# Patient Record
Sex: Female | Born: 1940 | Race: Black or African American | Hispanic: No
Health system: Southern US, Community
[De-identification: ages and names within clinical notes are randomized; demographics above are authoritative.]

---

## 2021-08-15 DIAGNOSIS — N1832 Chronic kidney disease, stage 3b: Secondary | ICD-10-CM | POA: Insufficient documentation

## 2021-08-15 DIAGNOSIS — G40901 Epilepsy, unspecified, not intractable, with status epilepticus: Secondary | ICD-10-CM

## 2021-08-15 DIAGNOSIS — Z79899 Other long term (current) drug therapy: Secondary | ICD-10-CM | POA: Insufficient documentation

## 2021-08-15 DIAGNOSIS — I482 Chronic atrial fibrillation, unspecified: Secondary | ICD-10-CM

## 2021-08-15 DIAGNOSIS — R569 Unspecified convulsions: Secondary | ICD-10-CM | POA: Diagnosis not present

## 2021-08-15 DIAGNOSIS — N1831 Chronic kidney disease, stage 3a: Secondary | ICD-10-CM

## 2021-08-15 DIAGNOSIS — Z952 Presence of prosthetic heart valve: Secondary | ICD-10-CM

## 2021-08-15 DIAGNOSIS — J9601 Acute respiratory failure with hypoxia: Secondary | ICD-10-CM

## 2021-08-15 DIAGNOSIS — G40919 Epilepsy, unspecified, intractable, without status epilepticus: Secondary | ICD-10-CM

## 2021-08-15 DIAGNOSIS — R791 Abnormal coagulation profile: Secondary | ICD-10-CM | POA: Insufficient documentation

## 2021-08-15 DIAGNOSIS — Z789 Other specified health status: Secondary | ICD-10-CM

## 2021-08-15 DIAGNOSIS — G8191 Hemiplegia, unspecified affecting right dominant side: Secondary | ICD-10-CM

## 2021-08-15 DIAGNOSIS — R061 Stridor: Secondary | ICD-10-CM

## 2021-08-15 DIAGNOSIS — J988 Other specified respiratory disorders: Secondary | ICD-10-CM

## 2021-08-15 DIAGNOSIS — I10 Essential (primary) hypertension: Secondary | ICD-10-CM

## 2021-08-15 DIAGNOSIS — I480 Paroxysmal atrial fibrillation: Secondary | ICD-10-CM

## 2021-08-15 DIAGNOSIS — Z7901 Long term (current) use of anticoagulants: Secondary | ICD-10-CM

## 2021-08-15 DIAGNOSIS — F0390 Unspecified dementia without behavioral disturbance: Secondary | ICD-10-CM

## 2021-08-15 DIAGNOSIS — R1310 Dysphagia, unspecified: Secondary | ICD-10-CM

## 2021-08-15 DIAGNOSIS — Z20822 Contact with and (suspected) exposure to covid-19: Secondary | ICD-10-CM | POA: Insufficient documentation

## 2021-08-15 DIAGNOSIS — D72829 Elevated white blood cell count, unspecified: Secondary | ICD-10-CM

## 2021-08-15 DIAGNOSIS — E876 Hypokalemia: Secondary | ICD-10-CM

## 2021-08-15 DIAGNOSIS — R4701 Aphasia: Secondary | ICD-10-CM

## 2021-08-15 NOTE — ED Notes (Addendum)
Patient's granddaughter, Renaldo Reel called at this time. Verbal consent obtained to transfer patient to [facility] via [facility]. Herbert Seta, RN witnessed verbal consent.

## 2021-08-15 NOTE — ED Triage Notes (Signed)
Pt arrives via [facility] from [facility]. Pt presented to [facility] for 15 minute witness seizure with R sided gaze. 2mg  ativan and 3g keppra given pta. Code stroke activated at . Per [facility]link, pt was moving all extremities en route. Pt arrives intubated with foley and og tube in place. Propofol infusing at 103mcg/kg/min.

## 2021-08-15 NOTE — ED Provider Notes (Signed)
[facility] Provider Note    Event Date/Time   First MD Initiated Contact with Patient 08/15/21 1322     (approximate)   History   Chief Complaint: Seizures   HPI  Miranda Rhodes is a 81 y.o. female with a past history of seizure disorder on Keppra who was brought to the ED due to witnessed seizure at home.  Last seizure was about a year ago and she has been compliant with her medications.  No recent acute illnesses or trauma.  On arrival patient is altered, not responsive and unable to provide any history or answer any questions or participate in exam.  EMS report that the patient did vomit 1 time on scene.     Physical Exam   Triage Vital Signs: ED Triage Vitals  Enc Vitals Group     BP      Pulse      Resp      Temp      Temp src      SpO2      Weight      Height      Head Circumference      Peak Flow      Pain Score      Pain Loc      Pain Edu?      Excl. in [place]?     Most recent vital signs: Vitals:   08/15/21 1530 08/15/21 1600  BP: (!) 169/87 (!) 194/89  Pulse: 62 66  Resp: (!) 23 (!) 22  Temp: (!) 96.8 F (36 C) (!) 96.7 F (35.9 C)  SpO2: 100% 100%    General: Not responsive. CV:  Good peripheral perfusion.  Regular rate rhythm Resp:  Normal effort.  Clear to auscultation bilaterally Abd:  No distention.  Soft nontender Other:  Not alert, not oriented.  Gaze fixed to the right.  Pupils 2 mm bilaterally, symmetric.  Extraocular movements untestable.  There are some supraclavicular muscle twitching movements, but otherwise no spontaneous muscle movements.  She has muscle tone with passive movement of the left upper extremity and left lower extremity, and flaccidity with passive movement of the right upper and right lower extremities.  No response to painful stimulus.   ED Results / Procedures / Treatments   Labs (all labs ordered are listed, but only abnormal results are displayed) Labs Reviewed  BASIC METABOLIC PANEL  - Abnormal; Notable for the following components:      Result Value   Glucose, Bld 116 (*)    Creatinine, Ser 1.47 (*)    Calcium 8.8 (*)    GFR, Estimated 36 (*)    All other components within normal limits  PROTIME-INR - Abnormal; Notable for the following components:   Prothrombin Time 23.8 (*)    INR 2.2 (*)    All other components within normal limits  APTT - Abnormal; Notable for the following components:   aPTT 40 (*)    All other components within normal limits  CBC - Abnormal; Notable for the following components:   RBC 3.76 (*)    All other components within normal limits  BLOOD GAS, ARTERIAL - Abnormal; Notable for the following components:   pH, Arterial 7.47 (*)    pO2, Arterial 156 (*)    Acid-Base Excess 3.9 (*)    All other components within normal limits  RESP PANEL BY RT-PCR (FLU A&B, COVID) ARPGX2  ETHANOL  DIFFERENTIAL  LACTIC ACID, PLASMA  URINE DRUG SCREEN, QUALITATIVE (  [facility] ONLY)  URINALYSIS, ROUTINE W REFLEX MICROSCOPIC     EKG Interpreted by me Sinus rhythm rate of 77, left axis, first-degree AV block.  Normal QRS ST segments and T waves.  No ischemic changes.   RADIOLOGY CT head interpreted by me, negative for intracranial hemorrhage.  Radiology report reviewed.  CTA/CTP head unremarkable.  Chest x-ray interpreted by me, endotracheal tube in good position.  Radiology report reviewed noting possible infiltrate in right midlung   PROCEDURES:  .Critical Care  Performed by: Sharman Cheek, MD Authorized by: Sharman Cheek, MD   Critical care provider statement:    Critical care time (minutes):  40   Critical care time was exclusive of:  Separately billable procedures and treating other patients   Critical care was necessary to treat or prevent imminent or life-threatening deterioration of the following conditions:  CNS failure or compromise   Critical care was time spent personally by me on the following activities:  Development of  treatment plan with patient or surrogate, discussions with consultants, evaluation of patient's response to treatment, examination of patient, obtaining history from patient or surrogate, ordering and performing treatments and interventions, ordering and review of laboratory studies, ordering and review of radiographic studies, pulse oximetry, re-evaluation of patient's condition and review of old charts   Care discussed with: accepting provider at another facility   Comments:       Procedure Name: Intubation Date/Time: 08/15/2021 4:09 PM  Performed by: Sharman Cheek, MDPre-anesthesia Checklist: Patient identified, Patient being monitored, Emergency Drugs available, Timeout performed and Suction available Oxygen Delivery Method: Non-rebreather mask Preoxygenation: Pre-oxygenation with 100% oxygen Induction Type: Rapid sequence Ventilation: Mask ventilation without difficulty Laryngoscope Size: Glidescope and 3 Grade View: Grade I Number of attempts: 1 Placement Confirmation: ETT inserted through vocal cords under direct vision, CO2 detector and Breath sounds checked- equal and bilateral Secured at: 22 cm Tube secured with: ETT holder Dental Injury: Teeth and Oropharynx as per pre-operative assessment        MEDICATIONS ORDERED IN ED: Medications  propofol (DIPRIVAN) 10 mg/mL bolus/IV push 167 mg (167 mg Intravenous See Procedure Record 08/15/21 1543)  propofol (DIPRIVAN) 1000 MG/100ML infusion (20 mcg/kg/min  83.5 kg Intravenous New Bag/Given 08/15/21 1458)  propofol (DIPRIVAN) 1000 MG/100ML infusion (  See Procedure Record 08/15/21 1542)  LORazepam (ATIVAN) injection 2 mg (2 mg Intravenous Given 08/15/21 1344)  iohexol (OMNIPAQUE) 350 MG/ML injection 100 mL (100 mLs Intravenous Contrast Given 08/15/21 1350)  levETIRAcetam (KEPPRA) 3,000 mg in sodium chloride 0.9 % 250 mL IVPB (0 mg Intravenous Stopped 08/15/21 1431)  rocuronium bromide 100 MG/10ML SOSY (60 mg  Given by Other 08/15/21 1450)   propofol (DIPRIVAN) 10 mg/mL bolus/IV push (120 mg  Given by Other 08/15/21 1450)  morphine (PF) 4 MG/ML injection 4 mg (4 mg Intravenous Given 08/15/21 1557)     IMPRESSION / MDM / ASSESSMENT AND PLAN / ED COURSE  I reviewed the triage vital signs and the nursing notes.                              Differential diagnosis includes, but is not limited to, status epilepticus, intracranial hemorrhage, LVO stroke, toxidrome  Patient's presentation is most consistent with acute presentation with potential threat to life or bodily function.  Patient presents with altered mental status, aphasia, right-sided paralysis, rightward gaze fixation.  She is having some jerking muscle movements as well.  Biggest concerns are status epilepticus versus  LVO stroke versus intracranial hemorrhage.  Will initiate code stroke, obtain stat CT head, plan to give IV Ativan to see if this improves her mental status.  ----------------------------------------- 4:12 PM on 08/15/2021 ----------------------------------------- CT and CT angiogram negative for acute findings.  Discussed case with neurology who evaluated the patient in the ED and recommends transfer to [facility] for continuous EEG monitoring due to suspected status epilepticus.  Patient's mental status is still poor, not interactive or able to follow commands, but the right gaze fixation has resolved to a neutral gaze.  Patient received Keppra load in the ED.  She was intubated for airway protection prior to transfer. Case was discussed with [facility] neurology Dr. Vanetta Shawl as well as ICU Dr. Imogene Burn.  Case discussed with ED attending Dr. Lynelle Doctor who accepts the patient for transfer due to lack of available ICU beds.  Patient given IV morphine postintubation for pain control, maintained on propofol infusion for sedation.       FINAL CLINICAL IMPRESSION(S) / ED DIAGNOSES   Final diagnoses:  Status epilepticus (HCC)     Rx / DC Orders   ED Discharge  Orders     None        Note:  This document was prepared using Dragon voice recognition software and may include unintentional dictation errors.   Sharman Cheek, MD 08/15/21 [phone]

## 2021-08-15 NOTE — ED Notes (Signed)
Neurology and pharmacist at bedside in CT

## 2021-08-15 NOTE — Procedures (Signed)
Patient Name: Miranda Rhodes  MRN: [id number]  Epilepsy Attending: Charlsie Quest  Referring Physician/Provider: Erick Blinks, MD Date: 10/16/2021 Duration: 29.29 mins  Patient history: 81 y.o. female with PMH of epilepsy on Keppra 500 mg twice daily who presented with right hemiplegia, right gaze deviation and aphasia along with subtle clonic movement of RUE. EEG to evaluate for seizure  Level of alertness:  comatose  AEDs during EEG study: LEV, Propofol  Technical aspects: This EEG study was done with scalp electrodes positioned according to the 10-20 International system of electrode placement. Electrical activity was acquired at a sampling rate of 500Hz  and reviewed with a high frequency filter of 70Hz  and a low frequency filter of 1Hz . EEG data were recorded continuously and digitally stored.   Description: EEG showed generalized burst of sharply contoured 5-8Hz  theta-alpha activity lasting 2-3 seconds alternating with low amplitude 15-18hz  generalized beta activity. Hyperventilation and photic stimulation were not performed.     ABNORMALITY -Burst attenuation, generalized  IMPRESSION: This study is suggestive of severe to profound diffuse encephalopathy, nonspecific etiology but likely related to sedation. No seizures or epileptiform discharges were seen throughout the recording.  Fillmore Bynum 

## 2021-08-15 NOTE — ED Provider Notes (Signed)
Pt transferred from [facility] for status epilepticus.  Pt intubated for airway protection while at [facility].  CCM has been expecting pt and are here to see pt.  Dr. Derry Lory (neurology) is also aware pt is here.  He will order the EEG.  Pt will be admitted by CCM.   Jacalyn Lefevre, MD 08/15/21 1753

## 2021-08-15 NOTE — ED Notes (Signed)
Per Dr. Scotty Court, activate code stroke. To CT at this time

## 2021-08-15 NOTE — ED Notes (Addendum)
Dr. Scotty Court, respiratory therapy, Herbert Seta RN, Beecher Mcardle RN, and this RN all at bedside for intubation. Time out conducted. Patient identified via name, birthday, and MRN

## 2021-08-15 NOTE — Progress Notes (Signed)
Pt transported to 4N22 on vent w/settings per flowsheet. Pt tol well.

## 2021-08-15 NOTE — ED Notes (Signed)
EMTALA reviewed by Charge RN 

## 2021-08-15 NOTE — Progress Notes (Signed)
LTM EEG hooked up and running - no initial skin breakdown - push button tested - neuro notified. [facility] monitoring.  

## 2021-08-15 NOTE — ED Notes (Signed)
Called [facility] for possible transfer spoke w/Kiona

## 2021-08-15 NOTE — H&P (Signed)
NAME:  Miranda Rhodes, MRN:  [id number], DOB:  03/18/1940, LOS: 0 ADMISSION DATE:  08/15/2021, CONSULTATION DATE:  08/15/21 REFERRING MD:  Dr. Selina Cooley, CHIEF COMPLAINT:  Seizure   History of Present Illness:  HPI obtained from medical chart review as patient is currently intubated and sedated on mechanical ventilation.  Also, pending chart merger with MRN [id number].   81 year old female with prior hx of afib and MR s/p MVR w/ St. Jude valve in 2005 on coumadin, HTN, HTN cardiomyopathy, HLD, prior L PCA stroke, seizure, mixed dementia (?alzheimers/ vascular dementia) who presented to [facility] ER after witnessed seizure at home.  Patient with prior hx of seizure in 05/2020, unexplained at that time, and since been on keppra with no reported seizures since.    EMS reported patient vomited prior to ER arrival.  In ER, code stroke was activated given her continued altered mental status, aphasia, right sided hemiparesis, and rightward gaze with some jerking movements noted.  Code stroke activated.  Seen by Neurology.  Initial CTH and CTA and perfusion were negative for LVO.   She was given ativan and keppra load which abated movements and intubated for airway protection.   She has been afebrile, hypertensive as high as 194/89, NSR and labs significant for sCr 1.47 (previous 1.4 in 05/2021), normal WBC and lactate, neg UA, INR 2.2 and CXR showing satisfactory placement of ETT/ OGT, questionable RML infiltrate.   She was transferred to [facility] for continous EEG monitoring and further care, PCCM admitting.   Spoke with son, Marvetta Gibbons by phone.  Patient was in her normal state of health prior to events today.  Reports compliance with keppra.  Notes hx of possible dementia however patient is independent in in her ADLs.   Pertinent  Medical History  Afib and MR s/p MVR w/ St. Jude 2005 on coumadin, HTN, HTN cardiomyopathy, HLD, prior L PCA stroke, seizure, mixed dementia (?alzheimer's/ vascular dementia)  [facility]  Events: Including procedures, antibiotic start and stop dates in addition to other pertinent events   Tx [facility] to [facility], witnessed seizure, vomited, intubated   Interim History / Subjective:  [facility] reported patient moving all extremities during transport.  Remains on propofol for sedation  Objective   Blood pressure (!) 163/91, pulse (!) 57, temperature (!) 96.6 F (35.9 C), temperature source Temporal, resp. rate 18, SpO2 100 %.    Vent Mode: AC FiO2 (%):  [40 %] 40 % Set Rate:  [16 bmp] 16 bmp Vt Set:  [450 mL] 450 mL PEEP:  [5 cmH20] 5 cmH20  No intake or output data in the 24 hours ending 08/15/21 1748 There were no vitals filed for this visit.  Examination: General:  critically ill elderly female intubated and sedated on MV in NAD HEENT: MM pink/moist, ETT, OGT, pupils 2/3/ reactive, anicteric, no neck rigidity  Neuro: sedated, does not open eyes or f/c CV: rr, NSR, +click PULM:  MV supported breaths, clear, no wheeze GI: soft, bs+, ND Extremities: warm/dry, no LE edema  Skin: no rashes   Resolved Hospital Problem list    Assessment & Plan:   Seizure r/o SE - hx of seizure 4/22 on keppra  P:  - Primary management per neurology  - Respiratory support as below/ above - Seizure precautions  - serial neuro exams  - ativan per seizure  - AEDs per neurology > keppra 1500mg  BID - cEEG per Neurology (MRI compatible leads) - MRI brain ordered - allow permissive HTN till  acute stroke ruled out, goal SBP < 220/110 - Maintain neuro protective measures; goal for eurothermia, euglycemia, eunatermia, normoxia, and PCO2 goal of 35-40 - Aspirations precautions  - no clear infectious etiology for seizure, suspect possible RML infiltrate could be from aspiration pna given witnessed vomiting while altered.  Consider LP, defer to Neuro.  Prior imaging questions normal pressure hydrocephalus and hx of questionable dementia, ? Etiology of seizure - monitor CBGs, add SSI if >  180  Acute respiratory insufficiency related to above Possible RML aspiration pna vs pneumonitis  - full MV support, 4-8cc/kg IBW with goal Pplat <30 and DP<15  - rate 15, recheck ABG in 1hr - CXR post transport  -VAP prevention protocol/ PPI -PAD protocol for sedation> propofol/ prn fentanyl with scheduled bowel regimen, RASS goal -1/-2 +/- per neuro depending on EEG findings - wean FiO2 as able for SpO2 >92%  - daily SAT & SBT- when cleared by neuro - send trach asp - hold on abx for now, monitor clinically  Hx Afib and MR s/p MVR w/St. Jude vavle on coumadin, INR goal 2-3 - remains in NSR, continue tele monitoring  - INR 2.2 today, recheck in am - hold coumadin.  Consider heparin bridge but hold for now till definitive neuro plans for ?LP/ MRI brain   HTN Hx HTN cardiomyopathy w/ recovered EF HLD - hold home HTN, allow for permissive HTN as above - euvolemic on exam   At risk for AKI, hx CKD3a - sCR at baseline - hold on foley, monitor for retention/ prn bladder scan - trend renal indices/ strict I/Os  Best Practice (right click and "Reselect all SmartList Selections" daily)   Diet/type: NPO DVT prophylaxis: SCD GI prophylaxis: PPI Lines: N/A Foley:  N/A Code Status:  full code Last date of multidisciplinary goals of care discussion [7/7]  Son, Aliene Beams updated by phone, [phone]  Labs   CBC: Recent Labs  Lab 08/15/21 1340  WBC 5.7  NEUTROABS 3.8  HGB 12.4  HCT 36.7  MCV 97.6  PLT 204    Basic Metabolic Panel: Recent Labs  Lab 08/15/21 1340  NA 140  K 3.7  CL 107  CO2 28  GLUCOSE 116*  BUN 17  CREATININE 1.47*  CALCIUM 8.8*   GFR: Estimated Creatinine Clearance: 32.6 mL/min (A) (by C-G formula based on SCr of 1.47 mg/dL (H)). Recent Labs  Lab 08/15/21 1340 08/15/21 1343  WBC 5.7  --   LATICACIDVEN  --  1.4    Liver Function Tests: No results for input(s): "AST", "ALT", "ALKPHOS", "BILITOT", "PROT", "ALBUMIN" in the last 168  hours. No results for input(s): "LIPASE", "AMYLASE" in the last 168 hours. No results for input(s): "AMMONIA" in the last 168 hours.  ABG    Component Value Date/Time   PHART 7.47 (H) 08/15/2021 1529   PCO2ART 38 08/15/2021 1529   PO2ART 156 (H) 08/15/2021 1529   HCO3 27.7 08/15/2021 1529   O2SAT 100 08/15/2021 1529     Coagulation Profile: Recent Labs  Lab 08/15/21 1340  INR 2.2*    Cardiac Enzymes: No results for input(s): "CKTOTAL", "CKMB", "CKMBINDEX", "TROPONINI" in the last 168 hours.  HbA1C: No results found for: "HGBA1C"  CBG: No results for input(s): "GLUCAP" in the last 168 hours.  Review of Systems:   Unable   Past Medical History:  See above  Surgical History:  Unable   Social History:    Unable   Family History:  Her family history is not  on file.   Allergies Not on File   Home Medications  Prior to Admission medications   Not on File     Critical care time: 45 mins     Kennieth Rad, ACNP [facility] 08/15/2021, 6:31 PM  See Amion for pager If no response to pager, please call PCCM consult pager After 7:00 pm call Elink

## 2021-08-15 NOTE — ED Notes (Signed)
Report given to [facility] at this time. Reported they are approximately 10 minutes out.

## 2021-08-15 NOTE — ED Notes (Signed)
Activated Code Stroke w/[facility] 

## 2021-08-15 NOTE — Progress Notes (Signed)
EEG complete - results pending 

## 2021-08-15 NOTE — ED Triage Notes (Signed)
Patient from home where she had a 15 minute witnessed seizure. History of same, last seizure was approx 1 year ago. Upon arrival patient had R sided gaze and weakness and was not answering questions.

## 2021-08-15 NOTE — Consult Note (Addendum)
NEUROLOGY CONSULTATION NOTE   Date of service: August 15, 2021 Patient Name: Miranda Rhodes MRN:  [id number] DOB:  1940-07-01 Reason for consult: stroke code Requesting physician: Dr. Sharman Cheek _ _ _   _ __   _ __ _ _  __ __   _ __   __ _  History of Present Illness   This patient's chart is marked for merge with her pre-existing chart MRN [id number]   Next of kin primary decision maker is Aliene Beams [phone]  This is an 81 year old woman with a past medical history significant for chronic A-fib and mechanical mitral valve on warfarin, hypertension, seizure disorder on Keppra 500 mg twice daily, prior stroke per family without residual deficits who was brought in by EMS for right hemiplegia, right gaze deviation, and aphasia.  She was last seen normal by her son at approximately 81 AM.  He came back to the house after visiting another relative and found her with the symptoms.  Stroke code was activated on ED arrival. NIHSS = 26 with exam c/w seizure (gaze deviation towards hemiparetic side, mute, intermittent clonic movements RUE). Clonic movements aborted with ativan and R gaze deviation became intermittent. Head CT NAICP. TNK was not administered 2/2 on warfarin with INR 2.2. CTA showed no LVO. CT perfusion was neg. CNS imaging personally reviewed.   She received 2mg  IV ativan and 3g keppra after which she was intubated for airway protection.   At baseline per son she has mild memory impairment but remains independent in her ADLs.  No identifiable precipitating trigger for presentation in status epilepticus but stroke is on the differential.   ROS   UTA 2/2 patient unresponsive  Past History   Patient's other chart marked for merge reviewed. Relevant pmhx in HPI. Fhx noncontributory.   NKDA   Medications   (Not in a hospital admission)     Current Facility-Administered Medications:    morphine (PF) 4 MG/ML injection 4 mg, 4 mg, Intravenous, Once, ,  MD   propofol (DIPRIVAN) 10 mg/mL bolus/IV push 167 mg, 2 mg/kg, Intravenous, Once, Sharman Cheek, MD   propofol (DIPRIVAN) 1000 MG/100ML infusion, 20 mcg/kg/min, Intravenous, Continuous, Sharman Cheek, MD, Last Rate: 10.02 mL/hr at 08/15/21 1458, 20 mcg/kg/min at 08/15/21 1458   propofol (DIPRIVAN) 1000 MG/100ML infusion, , , ,  No current outpatient medications on file.  Vitals   Vitals:   08/15/21 1406 08/15/21 1430 08/15/21 1457  BP: (!) 191/85 (!) 191/95   Pulse: 77 76   Resp: (!) 22 (!) 21   Temp: 98.4 F (36.9 C)    TempSrc: Axillary    SpO2: 96% 95% 98%  Weight: 83.5 kg    Height: 5\' 5"  (1.651 m)       Body mass index is 30.62 kg/m.  Physical Exam   Physical Exam Gen: alert, unable to answer orientation questions 2/2 mutism, does not follow simple commands HEENT: Atraumatic, normocephalic;mucous membranes moist; oropharynx clear, tongue without atrophy or fasciculations. Neck: Supple, trachea midline. Resp: CTAB, no w/r/r CV: RRR, no m/g/r; nml S1 and S2. 2+ symmetric peripheral pulses. Abd: soft/NT/ND; nabs x 4 quad Extrem: Nml bulk; no cyanosis, clubbing, or edema.  Neuro: *MS: alert, unable to answer orientation questions 2/2 mutism, does not follow simple commands *Speech: mute *CN: PERRL, no blink to threat bilat, R forced gaze deviation, face symmetric at rest *Motor & sensory: withdrawal to noxious stimuli LUE and LLE but not R. At one point she developed rhythmic clonic  movements of her RUE which aborted with ativan *Reflexes: 2+ symm throughout toes down bilat *Coordination, gait: UTA  NIHSS  1a Level of Conscious.: 0 1b LOC Questions: 2 1c LOC Commands: 2 2 Best Gaze: 2 3 Visual: 2 4 Facial Palsy: 0 5a Motor Arm - left: 3 5b Motor Arm - Right: 4 6a Motor Leg - Left: 3 6b Motor Leg - Right: 4 7 Limb Ataxia: 0 8 Sensory: 1 9 Best Language: 3 10 Dysarthria: 2 11 Extinct. and Inatten.: 0  TOTAL: 26 (after ativan, suspected to still be  seizing)   Premorbid mRS = 1   Labs   CBC:  Recent Labs  Lab 08/15/21 1340  WBC 5.7  NEUTROABS 3.8  HGB 12.4  HCT 36.7  MCV 97.6  PLT 204    Basic Metabolic Panel:  Lab Results  Component Value Date   NA 140 08/15/2021   K 3.7 08/15/2021   CO2 28 08/15/2021   GLUCOSE 116 (H) 08/15/2021   BUN 17 08/15/2021   CREATININE 1.47 (H) 08/15/2021   CALCIUM 8.8 (L) 08/15/2021   GFRNONAA 36 (L) 08/15/2021   Lipid Panel: No results found for: "LDLCALC" HgbA1c: No results found for: "HGBA1C" Urine Drug Screen: No results found for: "LABOPIA", "COCAINSCRNUR", "LABBENZ", "AMPHETMU", "THCU", "LABBARB"  Alcohol Level     Component Value Date/Time   ETH <10 08/15/2021 1340     Impression   This is an 81 year old woman with a past medical history significant for chronic A-fib and mechanical mitral valve on warfarin, hypertension, seizure disorder on Keppra 500 mg twice daily, prior stroke per family without residual deficits who was brought in by EMS for right hemiplegia, right gaze deviation, and aphasia with active seizure activity. Stroke code was called but patient was not a TNK candidate 2/2 on warfarin with INR 2.2. CT head NAICP / CTA no LVO / CTP neg. She received 2mg  IV ativan and 3g keppra and her RUE clonic movements abated but she remained altered, unable to follow commands, and aphasic with intermittent gaze deviation. She was intubated for airway protection and started on propofol and will be transferred ED-to-ED to [facility] for LTM EEG, MRI and further workup and mgmt of status epilepticus.   Recommendations   - Transfer to [facility] ED-to-ED - On arrival to [facility] please page neurohospitalist to come evaluate patient. She will need STAT EEG f/b LTM with MR-compatible leads - MRI brain after arrival to [facility] - Continue keppra 1500mg  bid - Workup for metabolic/infectious triggers for breakthrough seizure - She will ultimately need admission to 4N neuro-ICU under CCM when bed is  available; neuro will continue to follow - Further med mgmt incl warfarin per admitting team at [facility] - Given concern for possible acute ischemic infarct, please allow permissive HTN up to 220/110 until stroke is ruled out by MRI. Use clevidipine gtt prn for BP above parameters; avoid oral antihypertensives - STAT head CT for any decline in exam - Further stroke workup per [facility] neurohospitalist team if MRI shows e/o acute infarct  This patient is critically ill and at significant risk of neurological worsening, death and care requires constant monitoring of vital signs, hemodynamics,respiratory and cardiac monitoring, neurological assessment, discussion with family, other specialists and medical decision making of high complexity. I spent 110 minutes of neurocritical care time  in the care of  this patient. This was time spent independent of any time provided by nurse practitioner or PA.  [facility], MD [facility] [phone]  If 7pm- 7am, please page neurology on call as listed in [place].

## 2021-08-15 NOTE — Progress Notes (Signed)
   08/15/21 1400  Clinical Encounter Type  Visited With Patient not available;Health care provider  Visit Type Initial;Code;ED  Referral From Care management  Consult/Referral To Chaplain   Chaplain responded to Code Stroke. Patient not available at time of visit. No family present.

## 2021-08-15 NOTE — Consult Note (Signed)
NEUROLOGY CONSULTATION NOTE   Date of service: August 15, 2021 Patient Name: Miranda Rhodes MRN:  [id number] DOB:  [birth year] Reason for consult: "concern for status epilepticus" Requesting Provider: Coralyn Helling, MD _ _ _   _ __   _ __ _ _  __ __   _ __   __ _  History of Present Illness  Miranda Rhodes is a 81 y.o. female with PMH significant for mechanical mitral valve and A-fib on warfarin, hypertension, epilepsy on Keppra 500 mg twice daily who presented initially to [facility] emergency department by EMS with right hemiplegia, right gaze deviation and aphasia.  She was initially brought in as a code stroke with a last known well of 1100 on 08/15/2021.  Initial NIH stroke scale in the ED was 26.  She had work-up with CT head without contrast, CT angio, CT perfusion which was negative for a large territory infarct, no ICH, no LVO or significant stenosis, no mismatch.  In the ED, she was noted to have clonic movement of the right upper extremity along with the fact that she had right gaze deviation, high suspicion for prolonged seizure/status epilepticus.  She was given 2 mg of Ativan and loaded with Keppra 3000 mg.  She was intubated in the ED for airway protection she was transferred to [facility] for further evaluation with continuous EEG.  I spoke to her son over the phone who reports that she started her morning out as usual.  She seemed fine.  Son got up and ate some food.  He left for [employer] and came back 20 minutes later and found her seizing on the floor.  The seizure went on for 3 to 4 minutes and she never woke up from it.  He called EMS and she was brought in to the hospital.  Per son, has been at her baseline for the last couple days, has not been endorsing any fever, no chills.  No obvious signs or symptoms of a URI, UTI, gastroenteritis that the patient endorsed to the son noted over the last few days.  She is compliant with her Keppra.  She does not  use any recreational substances.  She has not been started on any new medications.  ROS   Unable to obtain detailed review of system due to obtunded mentation, sedation with propofol.  Past History  No past medical history on file.  No family history on file. Social History   Socioeconomic History   Marital status: Divorced    Spouse name: Not on file   Number of children: Not on file   Years of education: Not on file   Highest education level: Not on file  Occupational History   Not on file  Tobacco Use   Smoking status: Not on file   Smokeless tobacco: Not on file  Substance and Sexual Activity   Alcohol use: Not on file   Drug use: Not on file   Sexual activity: Not on file  Other Topics Concern   Not on file  Social History Narrative   Not on file   Social Determinants of Health   Financial Resource Strain: Not on file  Food Insecurity: Not on file  Transportation Needs: Not on file  Physical Activity: Not on file  Stress: Not on file  Social Connections: Not on file   Not on File  Medications  (Not in a hospital admission)    Vitals   Vitals:   08/15/21  1750 08/15/21 1751 08/15/21 1800 08/15/21 1804  BP:   (!) 200/84 (!) 191/85  Pulse:   (!) 58 (!) 56  Resp:   18 15  Temp:      TempSrc:      SpO2: 100%  100% 100%  Weight:  83.5 kg    Height:  5\' 5"  (1.651 m)       Body mass index is 30.62 kg/m.  Physical Exam   General: Laying comfortably in bed; intubated HENT: Normal oropharynx and mucosa. Normal external appearance of ears and nose.  Neck: Supple, no pain or tenderness  CV: No JVD. No peripheral edema.  Pulmonary: Symmetric Chest rise.  Breathing over vent Abdomen: Soft to touch, non-tender.  Ext: No cyanosis, edema, or deformity  Skin: No rash. Normal palpation of skin.   Musculoskeletal: Normal digits and nails by inspection. No clubbing.  Neurologic Examination performed on propofol 50 mcg/kg/min.  Mental status/Cognition: No  response to voice or loud clap.  No response to tactile stimuli or to noxious stimuli.   Speech/language: Mute, no attempts to communicate, does not follow any commands.   Cranial nerves:   CN II Pupils equal and reactive to light.   CN III,IV,VI EOM intact to doll's eye with no gaze preference.   CN V Corneal reflex positive bilaterally   CN VII No facial grimace to noxious stimuli.   CN VIII Does not turn head towards speech.   CN IX & X Cough intact, unable to get suction catheter backing up to elicit a gag.   CN XI Head midline   CN XII Difficult to establish position of tongue given intubation, does not protrude on command   Motor/sensory:  Muscle bulk: poor, tone flaccied in all extremities No response to proximal pinch in any of the extremities.  Coordination/Complex Motor:  Unable to assess.  Labs   CBC:  Recent Labs  Lab 08/15/21 1340  WBC 5.7  NEUTROABS 3.8  HGB 12.4  HCT 36.7  MCV 97.6  PLT 204    Basic Metabolic Panel:  Lab Results  Component Value Date   NA 140 08/15/2021   K 3.7 08/15/2021   CO2 28 08/15/2021   GLUCOSE 116 (H) 08/15/2021   BUN 17 08/15/2021   CREATININE 1.47 (H) 08/15/2021   CALCIUM 8.8 (L) 08/15/2021   GFRNONAA 36 (L) 08/15/2021   Lipid Panel: No results found for: "LDLCALC" HgbA1c: No results found for: "HGBA1C" Urine Drug Screen:     Component Value Date/Time   LABOPIA NONE DETECTED 08/15/2021 1400   COCAINSCRNUR NONE DETECTED 08/15/2021 1400   LABBENZ NONE DETECTED 08/15/2021 1400   AMPHETMU NONE DETECTED 08/15/2021 1400   THCU NONE DETECTED 08/15/2021 1400   LABBARB NONE DETECTED 08/15/2021 1400    Alcohol Level     Component Value Date/Time   ETH <10 08/15/2021 1340    CT Head without contrast(Personally reviewed): CTH was negative for a large hypodensity concerning for a large territory infarct or hyperdensity concerning for an ICH   CT angio Head and Neck with contrast(Personally reviewed): No LVO  CT  perfusion head(personally reviewed):  MRI Brain: pending  cEEG:  pending  Impression   Miranda Rhodes is a 81 y.o. female with PMH significant for mechanical mitral valve and A-fib on warfarin, hypertension, epilepsy on Keppra 500 mg twice daily who presented initially to [facility] emergency department by EMS with right hemiplegia, right gaze deviation and aphasia along with subtle clonic movement of  RUE.  Her presentation is very concerning for prolonged seizure/status epilepticus.  The underlying etiology though is unclear.  Work-up so far with CT head, CTA, CTP is nonrevealing.  Our plan is to put her up on continuous EEG for further evaluation and get further imaging with MRI brain   Recommendations  - cEEG overnight - Keppra 1500mg  BID - MRI Brain with and without contrast after cEEG.  This patient is critically ill and at significant risk of neurological worsening, death and care requires constant monitoring of vital signs, hemodynamics,respiratory and cardiac monitoring, neurological assessment, discussion with family, other specialists and medical decision making of high complexity. I spent 30 minutes of neurocritical care time  in the care of  this patient. This was time spent independent of any time provided by nurse practitioner or PA.  [facility] Pager Number Erick Blinks 08/15/2021  6:54 PM   ______________________________________________________________________   Thank you for the opportunity to take part in the care of this patient. If you have any further questions, please contact the neurology consultation attending.  Signed,  10/16/2021 [facility] Pager Number Erick Blinks _ _ _   _ __   _ __ _ _  __ __   _ __   __ _

## 2021-08-16 DIAGNOSIS — R569 Unspecified convulsions: Secondary | ICD-10-CM | POA: Diagnosis not present

## 2021-08-16 DIAGNOSIS — G40901 Epilepsy, unspecified, not intractable, with status epilepticus: Secondary | ICD-10-CM | POA: Diagnosis not present

## 2021-08-16 DIAGNOSIS — J988 Other specified respiratory disorders: Secondary | ICD-10-CM | POA: Diagnosis not present

## 2021-08-16 NOTE — Progress Notes (Signed)
eLink Physician-Brief Progress Note Patient Name: Miranda Rhodes DOB: [birth year] MRN: [id number]   Date of Service  08/16/2021  HPI/Events of Note  Received sliding scale coverage CBG 141, 132 Creatinine 1.53  eICU Interventions  Low dose SSI ordered     Intervention Category Intermediate Interventions: Hyperglycemia - evaluation and treatment  Rosalie Gums Viola Kinnick 08/16/2021, 8:42 PM

## 2021-08-16 NOTE — Progress Notes (Signed)
  Transition of Care [facility]) Screening Note   Patient Details  Name: Miranda Rhodes Date of Birth: 13-Mar-1940   Transition of Care [facility]) CM/SW Contact:    Windle Guard, LCSW Phone Number: 08/16/2021, 8:19 AM    Transition of Care Department [facility]) has reviewed patient and noted no immediate TOC needs pending continued medical work-up. TOC team will continue to monitor patient advancement through interdisciplinary progression rounds to support any identified discharge supports as needed. If new patient transition needs arise, please place a TOC consult or reach out to [facility] team.

## 2021-08-16 NOTE — Progress Notes (Signed)
[facility] ADULT ICU REPLACEMENT PROTOCOL   The patient does apply for the [facility] Adult ICU Electrolyte Replacment Protocol based on the criteria listed below:   1.Exclusion criteria: TCTS patients, ECMO patients, and Dialysis patients 2. Is GFR >/= 30 ml/min? Yes.    Patient's GFR today is 33 3. Is SCr </= 2? Yes.   Patient's SCr is 1.56 mg/dL 4. Did SCr increase >/= 0.5 in 24 hours? No. 5.Pt's weight >40kg  Yes.   6. Abnormal electrolyte(s): K+ 3.3, Mag 1.6  7. Electrolytes replaced per protocol 8.  Call MD STAT for K+ </= 2.5, Phos </= 1, or Mag </= 1 Physician:  Thresa Ross 08/16/2021 6:25 AM

## 2021-08-16 NOTE — Progress Notes (Signed)
EEG maint complete.  ?

## 2021-08-16 NOTE — Progress Notes (Signed)
NAME:  Miranda Rhodes, MRN:  [id number], DOB:  04/14/1940, LOS: 1 ADMISSION DATE:  08/15/2021, CONSULTATION DATE:  08/15/21 REFERRING MD:  Dr. Selina Cooley, CHIEF COMPLAINT:  Seizure   History of Present Illness:  HPI obtained from medical chart review as patient is currently intubated and sedated on mechanical ventilation.  Also, pending chart merger with MRN [id number].   81 year old female with prior hx of afib and MR s/p MVR w/ St. Jude valve in 2005 on coumadin, HTN, HTN cardiomyopathy, HLD, prior L PCA stroke, seizure, mixed dementia (?alzheimers/ vascular dementia) who presented to [facility] ER after witnessed seizure at home.  Patient with prior hx of seizure in 05/2020, unexplained at that time, and since been on keppra with no reported seizures since.    EMS reported patient vomited prior to ER arrival.  In ER, code stroke was activated given her continued altered mental status, aphasia, right sided hemiparesis, and rightward gaze with some jerking movements noted.  Code stroke activated.  Seen by Neurology.  Initial CTH and CTA and perfusion were negative for LVO.   She was given ativan and keppra load which abated movements and intubated for airway protection.   She has been afebrile, hypertensive as high as 194/89, NSR and labs significant for sCr 1.47 (previous 1.4 in 05/2021), normal WBC and lactate, neg UA, INR 2.2 and CXR showing satisfactory placement of ETT/ OGT, questionable RML infiltrate.   She was transferred to [facility] for continous EEG monitoring and further care, PCCM admitting.   Spoke with son, Marvetta Gibbons by phone.  Patient was in her normal state of health prior to events today.  Reports compliance with keppra.  Notes hx of possible dementia however patient is independent in in her ADLs.   Pertinent  Medical History  Afib and MR s/p MVR w/ St. Jude 2005 on coumadin, HTN, HTN cardiomyopathy, HLD, prior L PCA stroke, seizure, mixed dementia (?alzheimer's/ vascular dementia)  Significant Hospital  Events: Including procedures, antibiotic start and stop dates in addition to other pertinent events   7/7 Tx [facility] to [facility], witnessed seizure, vomited, intubated  7/8 No seizures overnight   Interim History / Subjective:  Sedated on vent   Objective   Blood pressure (!) 181/83, pulse 75, temperature 99.3 F (37.4 C), temperature source Bladder, resp. rate 15, height 5\' 5"  (1.651 m), weight 80.5 kg, SpO2 100 %.    Vent Mode: PRVC FiO2 (%):  [40 %] 40 % Set Rate:  [15 bmp-18 bmp] 15 bmp Vt Set:  [450 mL] 450 mL PEEP:  [5 cmH20] 5 cmH20 Plateau Pressure:  [15 cmH20-17 cmH20] 15 cmH20   Intake/Output Summary (Last 24 hours) at 08/16/2021 0840 Last data filed at 08/16/2021 0800 Gross per 24 hour  Intake 370.47 ml  Output 1300 ml  Net -929.53 ml   Filed Weights   08/15/21 1751 08/16/21 0000 08/16/21 0500  Weight: 83.5 kg 95.2 kg 80.5 kg    Examination: General: Acute on chronically ill appearing elderly female lying in bed on mechanical ventilation, in NAD HEENT: ETT, MM pink/moist, PERRL,  Neuro: Sedated on vent CV: s1s2 regular rate and rhythm, no murmur, rubs, or gallops,  PULM:  Clear to ascultation, no increased work of breathing, no added breath sounds  GI: soft, bowel sounds active in all 4 quadrants, non-tender, non-distended, tolerating TF Extremities: warm/dry, no edema  Skin: no rashes or lesions  Resolved Hospital Problem list    Assessment & Plan:   Seizure r/o SE -  hx of seizure 4/22 on keppra  -Head CT and CTA head negative for acute findings P:  Primary management per neurology  Maintain neuro protective measures; goal for eurothermia, euglycemia, eunatermia, normoxia, and PCO2 goal of 35-40 Neurology plans to slowly decrease sedation with EEG in place to monitor for recurrent seizures Nutrition and bowel regiment  Seizure precautions  AEDs per neurology  Aspirations precautions  Frequent neuro exams  Acute respiratory insufficiency related to  above Possible RML aspiration pna vs pneumonitis  P: Continue ventilator support with lung protective strategies  Wean PEEP and FiO2 for sats greater than 90%. Head of bed elevated 30 degrees. Plateau pressures less than 30 cm H20.  Follow intermittent chest x-ray and ABG.   SAT/SBT as tolerated, mentation preclude extubation  Ensure adequate pulmonary hygiene  Follow cultures  VAP bundle in place  PAD protocol Continue to monitor off antibiotics   Hx Afib and MR s/p MVR w/St. Jude vavle  -On coumadin, INR goal 2-3 -Will confirm with family if valve is mechanical vx prosthetic  P: Continuous telemetry Start heparin drip with eventual bridge to coumadin   HTN/HLD Hx HTN cardiomyopathy w/ recovered EF -Home medications include Norvasc, Coreg, Lasix,  P: Hold home medications  Continuous telemetry   At risk for AKI, hx CKD3a P: Follow renal function  Monitor urine output Trend Bmet Avoid nephrotoxins Ensure adequate renal perfusion   Hypokalemia  P: Trend Bmet  Supplement as needed   Medical sales representative (right click and "Control and instrumentation engineer" daily)   Diet/type: NPO DVT prophylaxis: SCD GI prophylaxis: PPI Lines: N/A Foley:  N/A Code Status:  full code Last date of multidisciplinary goals of care discussion: No family at bedside this am, will update on arrival    Critical care time: 45 mins   Performed by: Dona Walby D. Harris  Total critical care time: 38 minutes  Critical care time was exclusive of separately billable procedures and treating other patients.  Critical care was necessary to treat or prevent imminent or life-threatening deterioration.  Critical care was time spent personally by me on the following activities: development of treatment plan with patient and/or surrogate as well as nursing, discussions with consultants, evaluation of patient's response to treatment, examination of patient, obtaining history from patient or surrogate,  ordering and performing treatments and interventions, ordering and review of laboratory studies, ordering and review of radiographic studies, pulse oximetry and re-evaluation of patient's condition.  Joel Mericle D. Tiburcio Pea, NP-C [facility] Personal contact information can be found on Amion  08/16/2021, 8:58 AM

## 2021-08-16 NOTE — Progress Notes (Signed)
NEUROLOGY CONSULTATION PROGRESS NOTE   Date of service: August 16, 2021 Patient Name: Miranda Rhodes MRN:  [id number] DOB:  1940-09-23  Brief HPI  Miranda Rhodes is a 81 y.o. female with PMH significant for mechanical mitral valve and A-fib on warfarin, hypertension, epilepsy on Keppra 500 mg twice daily who presented initially to [facility] emergency department with prolonged seizure/status. Given Keppra 3G, Ativan 2mg  and intubated and started on propofol.  Transferred to [facility] for cEEG.   Interval Hx   No seizure so far on LTM. Propofol is down to 69mcg/kg/min  Vitals   Vitals:   08/16/21 0600 08/16/21 0700 08/16/21 0727 08/16/21 0800  BP: (!) 154/72 (!) 157/75  (!) 181/83  Pulse: 72 70 74 75  Resp: 15 15 17 15   Temp: 100 F (37.8 C) 99.7 F (37.6 C) 99.1 F (37.3 C) 99.3 F (37.4 C)  TempSrc:    Bladder  SpO2: 100% 100% 100% 100%  Weight:      Height:         Body mass index is 29.53 kg/m.  Physical Exam   General: Laying comfortably in bed; intubated HENT: Normal oropharynx and mucosa. Normal external appearance of ears and nose.  Neck: Supple, no pain or tenderness  CV: No JVD. No peripheral edema.  Pulmonary: Symmetric Chest rise.  Breathing over vent Abdomen: Soft to touch, non-tender.  Ext: No cyanosis, edema, or deformity  Skin: No rash. Normal palpation of skin.   Musculoskeletal: Normal digits and nails by inspection. No clubbing.  Neurologic Examination on propofol 60mcg/kg/min.  Mental status/Cognition: encephalopathic, eyes closed. No response to voice or loud clap. grimaces to tactile/noxious stimuli and moves her head left to right. Speech/language: mute, does not follow commands. Cranial nerves:   CN II Pupils equal and reactive to light   CN III,IV,VI EOM intact to dolls eyes, no gaze preference   CN V Corneals intact BL   CN VII Symmetric facial grimace   CN VIII Does not turn head towards speech   CN IX & X Did not assess  cough today, per RN was intact this AM.   CN XI Head midling, moves left to right with noxious stimuli   CN XII midline tongue but does not protrude on command.   Sensory/Motor:  Muscle bulk: poor, tone normal Localizes to noxious stimuli in BL uppers and withdraws BL lowers.  Coordination/Complex Motor:  Unable to assess - Gait: Deferred for patient safety.  Labs   Basic Metabolic Panel:  Lab Results  Component Value Date   NA 140 08/16/2021   K 3.3 (L) 08/16/2021   CO2 23 08/16/2021   GLUCOSE 100 (H) 08/16/2021   BUN 15 08/16/2021   CREATININE 1.56 (H) 08/16/2021   CALCIUM 8.8 (L) 08/16/2021   GFRNONAA 33 (L) 08/16/2021   HbA1c: No results found for: "HGBA1C" LDL: No results found for: "LDLCALC" Urine Drug Screen:     Component Value Date/Time   LABOPIA NONE DETECTED 08/15/2021 1400   COCAINSCRNUR NONE DETECTED 08/15/2021 1400   LABBENZ NONE DETECTED 08/15/2021 1400   AMPHETMU NONE DETECTED 08/15/2021 1400   THCU NONE DETECTED 08/15/2021 1400   LABBARB NONE DETECTED 08/15/2021 1400    Alcohol Level     Component Value Date/Time   ETH <10 08/15/2021 1340   No results found for: "PHENYTOIN", "ZONISAMIDE", "LAMOTRIGINE", "LEVETIRACETA" No results found for: "PHENYTOIN", "PHENOBARB", "VALPROATE", "CBMZ"  Imaging and Diagnostic studies   CT Head without contrast(Personally reviewed): CTH  was negative for a large hypodensity concerning for a large territory infarct or hyperdensity concerning for an ICH     CT angio Head and Neck with contrast(Personally reviewed): No LVO   CT perfusion head(personally reviewed):   MRI Brain: pending   cEEG:  This study is suggestive of severe to profound diffuse encephalopathy, nonspecific etiology but likely related to sedation. No seizures or epileptiform discharges were seen throughout the recording.  Impression   Miranda Rhodes is a 81 y.o. female with PMH significant for mechanical mitral valve and A-fib on warfarin,  hypertension, epilepsy on Keppra 500 mg twice daily who presented initially to [facility] emergency department by EMS with right hemiplegia, right gaze deviation and aphasia along with subtle clonic movement of RUE.  Her presentation is very concerning for prolonged seizure/status epilepticus.  The underlying etiology though is unclear.  Work-up so far with CT head, CTA, CTP is nonrevealing. She was intubated and put on cEEG with no seizures so far. Plan is to keep her on cEEG while weaning down her propofol and see if she has any further seizures.  Recommendations  - cEEG - Wean down propofol - Keppra 1500mg  BID. - if no seizures after few hours of propofol completely off, will take off cEEG and get MRI Brain with and without contrast. - will use precedex as needed for agitation. - Hold off on LP at this time. - continue warfarin given mechanical heart valve. - management of rest of the comorbidities per PCCM team. ______________________________________________________________________  This patient is critically ill and at significant risk of neurological worsening, death and care requires constant monitoring of vital signs, hemodynamics,respiratory and cardiac monitoring, neurological assessment, discussion with family, other specialists and medical decision making of high complexity. I spent 35 minutes of neurocritical care time  in the care of  this patient. This was time spent independent of any time provided by nurse practitioner or PA.  [facility] Pager Number Erick Blinks 08/16/2021  8:58 AM   Thank you for the opportunity to take part in the care of this patient. If you have any further questions, please contact the neurology consultation attending.  Signed,  10/17/2021 [facility] Pager Number Erick Blinks

## 2021-08-16 NOTE — Progress Notes (Signed)
Initial Nutrition Assessment  DOCUMENTATION CODES:   Not applicable  INTERVENTION:   Tube Feeding via OG:  Osmolite 1.2 at 55 ml/hr Pro-Source TF 45 mL daily TF at goal provides 84 g of protein, 1624 kcals and 1069 mL of free water   NUTRITION DIAGNOSIS:   Inadequate oral intake related to acute illness as evidenced by NPO status.  GOAL:   Patient will meet greater than or equal to 90% of their needs  MONITOR:   TF tolerance, Vent status, Labs, Weight trends, Skin  REASON FOR ASSESSMENT:   Consult, Ventilator Enteral/tube feeding initiation and management  ASSESSMENT:   81 yo female admitted status epilepticus, intubated for airway protection Pt transferred from [facility] to [facility] pot witnessed seizure, vomiting and intubation. PMH includes CVA, ventriculomegaly, seizures HTN, cardiomyopathy, mixed dementia, HLD  Pt remains on vent support, off propofol at the moment EEG ongoing, plan for MRI once off EEG  OG tube with tip and side port below GE junction per chest xray  Current wt 80.5 kg. No previous wt encounter. Noted weight of 80.5 kg and 95 kg from the same day; unsure which wt is accurate   At baseline, pt with possible dementia but is independent in ADLs at baseline.   Unable to obtain diet and weight history from patient at this time.   Labs: Creatinine 1.53, BUN wdl Meds: colace, Kcl, miralax   NUTRITION - FOCUSED PHYSICAL EXAM:  Unable to assess  Diet Order:   Diet Order             Diet NPO time specified  Diet effective now                   EDUCATION NEEDS:   Not appropriate for education at this time  Skin:  Skin Assessment: Reviewed RN Assessment  Last BM:  PTA  Height:   Ht Readings from Last 1 Encounters:  08/15/21 5\' 5"  (1.651 m)    Weight:   Wt Readings from Last 1 Encounters:  08/16/21 80.5 kg    BMI:  Body mass index is 29.53 kg/m.  Estimated Nutritional Needs:   Kcal:  1600-1800 kcals  Protein:  80-95  g  Fluid:  >/= 1.6 L  10/17/21 MS, RDN, LDN, CNSC Registered Dietitian 3 Clinical Nutrition RD Pager and On-Call Pager Number Located in [place]

## 2021-08-17 DIAGNOSIS — G40901 Epilepsy, unspecified, not intractable, with status epilepticus: Secondary | ICD-10-CM | POA: Diagnosis not present

## 2021-08-17 DIAGNOSIS — R569 Unspecified convulsions: Secondary | ICD-10-CM | POA: Diagnosis not present

## 2021-08-17 DIAGNOSIS — J988 Other specified respiratory disorders: Secondary | ICD-10-CM | POA: Diagnosis not present

## 2021-08-17 NOTE — Progress Notes (Signed)
Pt transported to MRI and back to 4N 22 on full vent support. No complications noted.

## 2021-08-17 NOTE — Progress Notes (Signed)
LTM EEG discontinued - no skin breakdown at unhook.   

## 2021-08-17 NOTE — Progress Notes (Signed)
NEUROLOGY CONSULTATION PROGRESS NOTE   Date of service: August 17, 2021 Patient Name: Miranda Rhodes MRN:  [id number] DOB:  11/11/1940  Brief HPI  Miranda Rhodes is a 81 y.o. female with PMH significant for mechanical mitral valve and A-fib on warfarin, hypertension, epilepsy on Keppra 500 mg twice daily who presented initially to [facility] emergency department with prolonged seizure/status. Given Keppra 3G, Ativan 2mg  and intubated and started on propofol.  Transferred to [facility] for cEEG.   Interval Hx   Off propofol and no seizures so far. LTM discontinued this AM.   Vitals   Vitals:   08/17/21 0600 08/17/21 0700 08/17/21 0733 08/17/21 0800  BP: (!) 144/62 (!) 113/56 (!) 113/56 (!) 121/56  Pulse: (!) 54 (!) 58 61 (!) 59  Resp: 15 17 17 17   Temp: 98.6 F (37 C) 98.8 F (37.1 C) 99 F (37.2 C) 99 F (37.2 C)  TempSrc: Bladder   Bladder  SpO2: 100% 100% 100% 100%  Weight:      Height:         Body mass index is 30.71 kg/m.  Physical Exam   General: Laying comfortably in bed; intubated HENT: Normal oropharynx and mucosa. Normal external appearance of ears and nose.  Neck: Supple, no pain or tenderness  CV: No JVD. No peripheral edema.  Pulmonary: Symmetric Chest rise.  Breathing over vent Abdomen: Soft to touch, non-tender.  Ext: No cyanosis, edema, or deformity  Skin: No rash. Normal palpation of skin.   Musculoskeletal: Normal digits and nails by inspection. No clubbing.  Neurologic Examination after precedex was turned off.  Mental status/Cognition: encephalopathic, eyes closed. Opens eyes to loud voice and makes eye contact on the left but not on the right. Antigravity movements in all extremities. Speech/language: intubated, does not seem to follow commands or mouth words but will mimic. Cranial nerves:   CN II Pupils equal and reactive to light, blinks to threat BL   CN III,IV,VI EOM intact, mild left gaze preference, will cross midline to look  to the right but not make eye contact.   CN V Regards touch BL   CN VII Symmetric facial grimace   CN VIII Turns head towards speech   CN IX & X Intubated but seems to be coughing on the tube.   CN XI Head midline, looks left and right.   CN XII midline tongue   Sensory/Motor:  Muscle bulk: poor, tone normal Localizes to pain in all extremities. Mimic and will hold her arms and legs off the bed briefly. Moves all extremities spontaneously. Does seem to move LUE and LLE more so than RUE and RLE. However, does have antigravity movements in all extremities.  Coordination/Complex Motor:  Unable to assess - Gait: Deferred for patient safety.  Labs   Basic Metabolic Panel:  Lab Results  Component Value Date   NA 139 08/16/2021   K 4.8 08/16/2021   CO2 22 08/16/2021   GLUCOSE 120 (H) 08/16/2021   BUN 16 08/16/2021   CREATININE 1.53 (H) 08/16/2021   CALCIUM 8.6 (L) 08/16/2021   GFRNONAA 34 (L) 08/16/2021   HbA1c:  Lab Results  Component Value Date   HGBA1C 5.5 08/17/2021   LDL: No results found for: "LDLCALC" Urine Drug Screen:     Component Value Date/Time   LABOPIA NONE DETECTED 08/15/2021 1400   COCAINSCRNUR NONE DETECTED 08/15/2021 1400   LABBENZ NONE DETECTED 08/15/2021 1400   AMPHETMU NONE DETECTED 08/15/2021 1400  THCU NONE DETECTED 08/15/2021 1400   LABBARB NONE DETECTED 08/15/2021 1400    Alcohol Level     Component Value Date/Time   ETH <10 08/15/2021 1340   No results found for: "PHENYTOIN", "ZONISAMIDE", "LAMOTRIGINE", "LEVETIRACETA" No results found for: "PHENYTOIN", "PHENOBARB", "VALPROATE", "CBMZ"  Imaging and Diagnostic studies   CT Head without contrast(Personally reviewed): CTH was negative for a large hypodensity concerning for a large territory infarct or hyperdensity concerning for an ICH     CT angio Head and Neck with contrast(Personally reviewed): No LVO   CT perfusion head(personally reviewed):   MRI Brain: pending    cEEG(7/8-7/9):  This study showed evidence of epileptogenicity arising from left frontal region. There is also cortical dysfunction arising from left parieto-occipital region likely due to underlying stroke.  Additionally there is moderate diffuse encephalopathy, non specific etiology. . No seizures were seen throughout the recording.  Impression   Miranda Rhodes is a 81 y.o. female with PMH significant for mechanical mitral valve and A-fib on warfarin, hypertension, epilepsy on Keppra 500 mg twice daily who presented initially to [facility] emergency department by EMS with right hemiplegia, right gaze deviation and aphasia along with subtle clonic movement of RUE.  Her presentation is very concerning for prolonged seizure/status epilepticus.  The underlying etiology though is unclear.  Work-up so far with CT head, CTA, CTP is nonrevealing. She was intubated and transferred to [facility]. cEEG with no seizures. She was weaned off propofol with cEEG demonstrating evidence of epileptogenicity arising from left frontal region. There is also cortical dysfunction arising from left parieto-occipital region likely due to underlying stroke. No seizures.   Recommendations  - cEEG discontinued since seizure free for several hours off propofol - Keppra 1500mg  BID. - On low dose precdex and waking up, PCCM plans SBT with possible extubation. - MRI Brain with and without contrast ordered and pending. - Hold off on LP at this time. - continue warfarin given heart valve, althou unclear if this is mechanical. - management of rest of the comorbidities per PCCM team. ______________________________________________________________________  This patient is critically ill and at significant risk of neurological worsening, death and care requires constant monitoring of vital signs, hemodynamics,respiratory and cardiac monitoring, neurological assessment, discussion with family, other specialists and  medical decision making of high complexity. I spent 35 minutes of neurocritical care time  in the care of  this patient. This was time spent independent of any time provided by nurse practitioner or PA.  [facility] Pager Number Erick Blinks 08/17/2021  8:56 AM   Thank you for the opportunity to take part in the care of this patient. If you have any further questions, please contact the neurology consultation attending.  Signed,  10/18/2021 [facility] Pager Number Erick Blinks

## 2021-08-17 NOTE — Progress Notes (Signed)
NAME:  Miranda Rhodes, MRN:  [id number], DOB:  23-May-1940, LOS: 2 ADMISSION DATE:  08/15/2021, CONSULTATION DATE:  08/15/21 REFERRING MD:  Dr. Quinn Axe, CHIEF COMPLAINT:  Seizure   History of Present Illness:  HPI obtained from medical chart review as patient is currently intubated and sedated on mechanical ventilation.  Also, pending chart merger with MRN [id number].   81 year old female with prior hx of afib and MR s/p MVR w/ St. Jude valve in 2005 on coumadin, HTN, HTN cardiomyopathy, HLD, prior L PCA stroke, seizure, mixed dementia (?alzheimers/ vascular dementia) who presented to [facility] ER after witnessed seizure at home.  Patient with prior hx of seizure in 05/2020, unexplained at that time, and since been on keppra with no reported seizures since.    EMS reported patient vomited prior to ER arrival.  In ER, code stroke was activated given her continued altered mental status, aphasia, right sided hemiparesis, and rightward gaze with some jerking movements noted.  Code stroke activated.  Seen by Neurology.  Initial CTH and CTA and perfusion were negative for LVO.   She was given ativan and keppra load which abated movements and intubated for airway protection.   She has been afebrile, hypertensive as high as 194/89, NSR and labs significant for sCr 1.47 (previous 1.4 in 05/2021), normal WBC and lactate, neg UA, INR 2.2 and CXR showing satisfactory placement of ETT/ OGT, questionable RML infiltrate.   She was transferred to [facility] for continous EEG monitoring and further care, PCCM admitting.   Spoke with son, Gray Bernhardt by phone.  Patient was in her normal state of health prior to events today.  Reports compliance with keppra.  Notes hx of possible dementia however patient is independent in in her ADLs.   Pertinent  Medical History  Afib and MR s/p MVR w/ St. Jude 2005 on coumadin, HTN, HTN cardiomyopathy, HLD, prior L PCA stroke, seizure, mixed dementia (?alzheimer's/ vascular dementia)  Significant Hospital  Events: Including procedures, antibiotic start and stop dates in addition to other pertinent events   7/7 Tx [facility] to [facility], witnessed seizure, vomited, intubated  7/8 sedation weaned off while on EEG to observe for recurrent seizures none seen 7/9 no issues overnight, off sedation. No overnight seizures on EEG  Interim History / Subjective:  Withdraws from pain on ventilator, unable to follow commands.  Off sedation  Objective   Blood pressure (!) 113/56, pulse (!) 58, temperature 98.8 F (37.1 C), resp. rate 17, height $RemoveBe'5\' 5"'efESXwWpu$  (1.651 m), weight 83.7 kg, SpO2 100 %.    Vent Mode: PRVC FiO2 (%):  [40 %] 40 % Set Rate:  [15 bmp] 15 bmp Vt Set:  [450 mL] 450 mL PEEP:  [5 cmH20] 5 cmH20 Plateau Pressure:  [12 cmH20-16 cmH20] 12 cmH20   Intake/Output Summary (Last 24 hours) at 08/17/2021 0711 Last data filed at 08/17/2021 0700 Gross per 24 hour  Intake 1672.74 ml  Output 995 ml  Net 677.74 ml    Filed Weights   08/16/21 0000 08/16/21 0500 08/17/21 0500  Weight: 95.2 kg 80.5 kg 83.7 kg    Examination: General: Acute on chronic ill-appearing elderly female lying in bed in no acute distress HEENT: ETT, MM pink/moist, PERRL,  Neuro: Withdraws from pain, unable to follow commands, off sedation CV: s1s2 regular rate and rhythm, no murmur, rubs, or gallops,  PULM: Auscultation bilaterally, no increased work of breathing, no added breath sounds, tolerating ventilator GI: soft, bowel sounds active in all 4 quadrants, non-tender,  non-distended, tolerating TF Extremities: warm/dry, no edema  Skin: no rashes or lesions  Resolved Hospital Problem list    Assessment & Plan:   Seizure r/o SE - hx of seizure 4/22 on keppra  -Head CT and CTA head negative for acute findings P:  Primary management per neurology Maintain neuroprotective measures Remains on continuous EEG, stop per neurology Nutrition and bowel regiment Seizure precautions AEDs per neurology Aspiration precautions Frequent  neuro exam  Acute respiratory insufficiency related to above Possible RML aspiration pna vs pneumonitis  P: Attempt SBT this a.m. with sedation weaned Continue ventilator support with lung protective strategies  Wean PEEP and FiO2 for sats greater than 90%. Head of bed elevated 30 degrees. Plateau pressures less than 30 cm H20.  Follow intermittent chest x-ray and ABG.   Ensure adequate pulmonary hygiene  Follow cultures  VAP bundle in place  PAD protocol Continue to monitor off antibiotics  Hx Afib and MR s/p MVR w/St. Jude vavle  -On coumadin, INR goal 2-3 -Will confirm with family if valve is mechanical vx prosthetic  P: Continuous telemetry Start heparin drip once INR drops below goal   HTN/HLD Hx HTN cardiomyopathy w/ recovered EF -Home medications include Norvasc, Coreg, Lasix,  P: Hold home medications Continuous telemetry  At risk for AKI, hx CKD3a P: Follow renal function Monitor urine output Trend be met Avoid nephrotoxins Ensure adequate renal perfusion  Hypokalemia  P: Trend to be met Supplement as needed  Best Practice (right click and "Reselect all SmartList Selections" daily)   Diet/type: NPO DVT prophylaxis: SCD GI prophylaxis: PPI Lines: N/A Foley:  N/A Code Status:  full code Last date of multidisciplinary goals of care discussion: No family at bedside this am, will update on arrival    Critical care time: 45 mins   Performed by: Izaah Westman D. Harris  Total critical care time: 37 minutes  Critical care time was exclusive of separately billable procedures and treating other patients.  Critical care was necessary to treat or prevent imminent or life-threatening deterioration.  Critical care was time spent personally by me on the following activities: development of treatment plan with patient and/or surrogate as well as nursing, discussions with consultants, evaluation of patient's response to treatment, examination of patient, obtaining  history from patient or surrogate, ordering and performing treatments and interventions, ordering and review of laboratory studies, ordering and review of radiographic studies, pulse oximetry and re-evaluation of patient's condition.  Antionne Enrique D. Kenton Kingfisher, NP-C [facility] Personal contact information can be found on Amion  08/17/2021, 7:11 AM

## 2021-08-18 DIAGNOSIS — R569 Unspecified convulsions: Secondary | ICD-10-CM | POA: Diagnosis not present

## 2021-08-18 NOTE — Procedures (Signed)
Cortrak  Person Inserting Tube:  Kendell Bane C, RD Tube Type:  Cortrak - 43 inches Tube Location:  Left nare Secured by: Bridle Technique Used to Measure Tube Placement:  Marking at nare/corner of mouth Cortrak Secured At:  66 cm   Cortrak Tube Team Note:  Consult received to place a Cortrak feeding tube.   X-ray is required, abdominal x-ray has been ordered by the Cortrak team. Please confirm tube placement before using the Cortrak tube.   If the tube becomes dislodged please keep the tube and contact the Cortrak team at [email or web address] ([email or web address]) for replacement.  If after hours and replacement cannot be delayed, place a NG tube and confirm placement with an abdominal x-ray.    Cammy Copa., RD, LDN, CNSC See AMiON for contact information

## 2021-08-18 NOTE — Progress Notes (Signed)
[facility] P[facility]M  Called to bedside to assess stridor that developed approximately 90 minutes after extubation NTS suctioning has not helped On exam she is in no respiratory distress, no accessory muscle use, however she does have inspiratory predominant stridor audible from the bedside She tells me that she feels OK and doesn't feel short of breath Ddx include airway edema vs functional vocal cord derangement (vocal cord dysfunction) Plan: ativan now, decadron now, continue close monitoring in ICU setting.  May need re-intubation, will monitor carefully.  Heber Linwood, MD [place] [facility] Pager: [phone] Cell: [phone] After 7:00 pm call Elink  [phone]

## 2021-08-18 NOTE — Progress Notes (Signed)
[facility]  Re-assessment, respiratory status unchanged Has inspiratory only mild stridor, no audible sound on exhalation or phonation, able to phonate normally, has no accessory muscle use; denies dyspnea, cough and ability to clear airway secretions intact Suspect this is mostly functional but will continue to monitor carefully Continue decadron, prn xanax Prefer to avoid re-intubation unless in clear respiratory distress.  Monitor closely for worsening accessory muscle use, respiratory rate, vital sign abnormalities   Miranda Bradford Woods, MD [place] [facility] Pager: [phone] Cell: [phone] After 7:00 pm call Elink  [phone]

## 2021-08-18 NOTE — Progress Notes (Signed)
NEUROLOGY CONSULTATION PROGRESS NOTE   Date of service: August 18, 2021 Patient Name: Miranda Rhodes MRN:  [id number] DOB:  [birth year]  Brief HPI  Miranda Rhodes is a 81 y.o. female with PMH significant for mechanical mitral valve and A-fib on warfarin, hypertension, epilepsy on Keppra 500 mg twice daily who presented initially to [facility] emergency department with prolonged seizure/status. Given Keppra 3G, Ativan 2mg  and intubated and started on propofol.  Transferred to [facility] for cEEG.   Interval Hx   Intubated but following commands. On precedex that was held this AM.   Vitals   Vitals:   08/18/21 0742 08/18/21 0800 08/18/21 1008 08/18/21 1039  BP: (!) 161/69 (!) 167/62 (!) 145/68   Pulse: (!) 54 (!) 51 (!) 59   Resp:  15    Temp:  97.7 F (36.5 C)    TempSrc:  Bladder    SpO2: 100% 100%    Weight:    79.6 kg  Height:         Body mass index is 29.2 kg/m.  Physical Exam   General: Intubated, no sedation  HENT: Normal oropharynx and mucosa. Normal external appearance of ears and nose.  Neck: Supple, no pain or tenderness  CV: No JVD. No peripheral edema.  Pulmonary: Symmetric Chest rise.  Breathing over vent Abdomen: Soft to touch, non-tender.  Ext: No cyanosis, edema, or deformity  Skin: No rash. Normal palpation of skin.   Musculoskeletal: Normal digits and nails by inspection. No clubbing.  Neurologic Examination after precedex was turned off.  Mental status/speech/language/Cognition: intubated, on no sedation for now, opens eyes, follows simple commands in all 4s  CN II Pupils equal and reactive to light, blinks to threat BL   CN III,IV,VI EOM intact, no gaze preference   CN V Regards touch BL   CN VII Symmetric facial grimace   CN VIII Turns head towards speech   CN IX & X Intubated but seems to be coughing on the tube.   CN XI Head midline, looks left and right.   CN XII midline tongue   Sensory/Motor:  Able to lift both UE  antigravity without drift. Has symmetric drift in b/l LE.  Coordination/Complex Motor:  Unable to assess - Gait: Deferred for patient safety.  Labs   Basic Metabolic Panel:  Lab Results  Component Value Date   NA 137 08/18/2021   K 4.1 08/18/2021   CO2 22 08/18/2021   GLUCOSE 142 (H) 08/18/2021   BUN 24 (H) 08/18/2021   CREATININE 1.47 (H) 08/18/2021   CALCIUM 8.3 (L) 08/18/2021   GFRNONAA 36 (L) 08/18/2021   HbA1c:  Lab Results  Component Value Date   HGBA1C 5.5 08/17/2021   LDL: No results found for: "LDLCALC" Urine Drug Screen:     Component Value Date/Time   LABOPIA NONE DETECTED 08/15/2021 1400   COCAINSCRNUR NONE DETECTED 08/15/2021 1400   LABBENZ NONE DETECTED 08/15/2021 1400   AMPHETMU NONE DETECTED 08/15/2021 1400   THCU NONE DETECTED 08/15/2021 1400   LABBARB NONE DETECTED 08/15/2021 1400    Alcohol Level     Component Value Date/Time   ETH <10 08/15/2021 1340   Imaging and Diagnostic studies   CT Head without contrast(Personally reviewed): CTH was negative for a large hypodensity concerning for a large territory infarct or hyperdensity concerning for an ICH    CT angio Head and Neck with contrast(Personally reviewed): No LVO   CT perfusion head(personally reviewed):   MRI Brain:  IMPRESSION: 1. No acute abnormality. 2. Similar ventriculomegaly, which is out of proportion to the degree of sulcal enlargement. This could represent normal pressure hydrocephalus or central predominant atrophy. 3. Periventricular T2/FLAIR hyperintensity, which could represent chronic microvascular ischemic disease or transependymal flow of CSF given the above findings. 4. Remote left occipital infarct.   cEEG(7/8-7/9):  This study showed evidence of epileptogenicity arising from left frontal region. There is also cortical dysfunction arising from left parieto-occipital region likely due to underlying stroke.  Additionally there is moderate diffuse encephalopathy, non  specific etiology. . No seizures were seen throughout the recording.  Impression   Miranda Rhodes is a 81 y.o. female with PMH significant for mechanical mitral valve and A-fib on warfarin, hypertension, epilepsy on Keppra 500 mg twice daily who presented initially to [facility] emergency department by EMS with right hemiplegia, right gaze deviation and aphasia along with subtle clonic movement of RUE.  Her presentation is very concerning for prolonged seizure/status epilepticus.  The underlying etiology though is unclear.  Work-up so far with CT head, CTA, CTP is nonrevealing. MRI neg for acute stroke. She was intubated and transferred to [facility]. cEEG with no seizures. She was weaned off propofol with cEEG demonstrating evidence of epileptogenicity arising from left frontal region. There is also cortical dysfunction arising from left parieto-occipital region likely due to underlying stroke. No seizures. Following commands this AM.   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 - Seizure precautions - Extubation per primary team. - continue warfarin given heart valve, althou unclear if this is mechanical. - management of rest of the comorbidities per PCCM team. - Outpatient neurology follow up in 4-6 weeks after discharge.  Plan d/w , NP PCCM -- Canary Brim, MD Neurologist [facility] Pager: [phone]   CRITICAL CARE ATTESTATION Performed by: [phone], MD Total critical care time: 31 minutes Critical care time was exclusive of separately billable procedures and treating other patients and/or supervising APPs/Residents/Students Critical care was necessary to treat or prevent imminent or life-threatening deterioration due to status epilepticus  This patient is critically ill and at significant risk for neurological worsening and/or death and care requires constant  monitoring. Critical care was time spent personally by me on the following activities: development of treatment plan with patient and/or surrogate as well as nursing, discussions with consultants, evaluation of patient's response to treatment, examination of patient, obtaining history from patient or surrogate, ordering and performing treatments and interventions, ordering and review of laboratory studies, ordering and review of radiographic studies, pulse oximetry, re-evaluation of patient's condition, participation in multidisciplinary rounds and medical decision making of high complexity in the care of this patient.

## 2021-08-18 NOTE — Progress Notes (Signed)
NAME:  Miranda Rhodes, MRN:  [id number], DOB:  Mar 05, 1940, LOS: 3 ADMISSION DATE:  08/15/2021, CONSULTATION DATE:  08/15/21 REFERRING MD:  Dr. Selina Cooley, CHIEF COMPLAINT:  Seizure   History of Present Illness:  HPI obtained from medical chart review as patient is currently intubated and sedated on mechanical ventilation.  Also, pending chart merger with MRN [id number].   81 year old female with prior hx of afib and MR s/p MVR w/ St. Jude valve in 2005 on coumadin, HTN, HTN cardiomyopathy, HLD, prior L PCA stroke, seizure, mixed dementia (?alzheimers/ vascular dementia) who presented to [facility] ER after witnessed seizure at home.  Patient with prior hx of seizure in 05/2020, unexplained at that time, and since been on keppra with no reported seizures since.    EMS reported patient vomited prior to ER arrival.  In ER, code stroke was activated given her continued altered mental status, aphasia, right sided hemiparesis, and rightward gaze with some jerking movements noted.  Code stroke activated.  Seen by Neurology.  Initial CTH and CTA and perfusion were negative for LVO.   She was given ativan and keppra load which abated movements and intubated for airway protection. Afebrile, hypertensive as high as 194/89, NSR and labs significant for sCr 1.47 (previous 1.4 in 05/2021), normal WBC and lactate, neg UA, INR 2.2 and CXR showing satisfactory placement of ETT/ OGT, questionable RML infiltrate.   She was transferred to [facility] for continous EEG monitoring and further care, PCCM admitting.   Spoke with son, Miranda Rhodes by phone.  Patient was in her normal state of health prior to events today.  Reports compliance with keppra.  Notes hx of possible dementia however patient is independent in in her ADLs.   Pertinent  Medical History  Afib and MR s/p MVR w/ St. Jude 2005 on coumadin, HTN, HTN cardiomyopathy, HLD, prior L PCA stroke, seizure, mixed dementia (?alzheimer's/ vascular dementia)  Significant Hospital  Events: Including procedures, antibiotic start and stop dates in addition to other pertinent events   7/7 Tx [facility] to [facility], witnessed seizure, vomited, intubated  7/8 sedation weaned off while on EEG to observe for recurrent seizures none seen 7/9 no issues overnight, off sedation. No overnight seizures on EEG.  Withdraws from pain, no follow commands off sedation.   Interim History / Subjective:  RT reports pt with no effort on SBT while on precedex  Afebrile  Normal flora in respiratory culture  Glucose range 116-142  I/O UOP, +611ml in last 24 hours   Objective   Blood pressure (!) 167/62, pulse (!) 51, temperature 97.7 F (36.5 C), temperature source Bladder, resp. rate 15, height 5\' 5"  (1.651 m), weight 82.7 kg, SpO2 100 %.    Vent Mode: PRVC FiO2 (%):  [40 %] 40 % Set Rate:  [15 bmp] 15 bmp Vt Set:  [450 mL] 450 mL PEEP:  [5 cmH20] 5 cmH20 Pressure Support:  [5 cmH20] 5 cmH20 Plateau Pressure:  [12 cmH20-16 cmH20] 15 cmH20   Intake/Output Summary (Last 24 hours) at 08/18/2021 0858 Last data filed at 08/18/2021 0600 Gross per 24 hour  Intake 1274.61 ml  Output 665 ml  Net 609.61 ml   Filed Weights   08/16/21 0500 08/17/21 0500 08/18/21 0327  Weight: 80.5 kg 83.7 kg 82.7 kg    Examination: General: elderly adult female lying in bed in vent in NAD  HEENT: MM pink/moist, ETT, gastric tube in place, anicteric  Neuro: Awake off precedex, following commands / able to  hold limbs up against gravity CV: s1s2 RRR, SB on monitor, no m/r/g PULM: non-labored at rest, lungs bilaterally clear  GI: soft, bsx4 active  Extremities: warm/dry, no edema  Skin: no rashes or lesions  Resolved Hospital Problem list    Assessment & Plan:   Seizure r/o SE Hx of seizure 4/22 on keppra, Head CT and CTA head negative for acute findings.  Hx of old CVA, dementia. Right sided gaze on presentation, ? Related to old L CVA vs dementia.  -appreciate Neurology evaluation  -seizure  precautions  -defer AED's to Neurology  -follow clinical neuro exam  -ok per Neuro to proceed with extubation once meets criteria  -early PT efforts  Acute respiratory insufficiency related to above Basilar ATX -SBT/WUA with goal for extubation  -wean PEEP / FiO2 for sats >90% -VAP prevention measures -follow intermittent CXR  -monitor off abx  -hold TF with goal for extubation   Hx Afib and MR s/p MVR w/St. Jude Valve  -continue coumadin per pharmacy  -INR goal 2-3 -On coumadin, INR goal 2-3 -tele monitoring  -heparin infusion once INR drops below goal (pending timing, may be able to resume oral coumadin)  HTN/HLD Hx HTN cardiomyopathy w/ recovered EF PTA meds lasix, diovan, coreg -tele monitoring  -resume home coreg, lipitor -hold home lasix, diovan   At risk for AKI, hx CKD3a -Trend BMP / urinary output -Avoid nephrotoxic agents, ensure adequate renal perfusion  Hypokalemia  -monitor, replace as indicated  Best Practice (right click and "Reselect all SmartList Selections" daily)  Diet/type: NPO DVT prophylaxis: SCD GI prophylaxis: PPI Lines: N/A Foley:  N/A Code Status:  full code Last date of multidisciplinary goals of care discussion: Son, Miranda Rhodes, updated via phone 7/10 on plan of care.    Critical care time: 37 minutes   Canary Brim, MSN, APRN, NP-C, AGACNP-BC [facility] 08/18/2021, 8:58 AM   Please see [email or web address] for pager details.   From 7A-7P if no response, please call [phone] After hours, please call ELink [phone]

## 2021-08-18 NOTE — Progress Notes (Addendum)
eLink Physician-Brief Progress Note Patient Name: Miranda Rhodes DOB: Nov 27, 1940 MRN: [id number]   Date of Service  08/18/2021  HPI/Events of Note  RN asking for a CXR order.  Pt remains intubated.  eICU Interventions  CXR ordered as per bedside request.      Intervention Category Minor Interventions: Other:  Larinda Buttery 08/18/2021, 6:41 AM

## 2021-08-18 NOTE — Progress Notes (Signed)
Pt found with audible stridor. Oxygen saturations maintained >94%, pt attempting to talk  RT called to bedside, MD McQuaid called to bedside. New orders placed by MD.

## 2021-08-18 NOTE — Procedures (Signed)
Extubation Procedure Note  Patient Details:   Name: Miranda Rhodes DOB: 29-Dec-1940 MRN: [id number]   Airway Documentation:    Vent end date: 08/18/21 Vent end time: 1135   Evaluation  O2 sats: stable throughout Complications: No apparent complications Patient did tolerate procedure well. Bilateral Breath Sounds: Clear, Diminished   Yes  Pt extubated per physician order. Pt suctioned via ETT and orally prior, positive cuff leak heard. Pt extubated to 3L nasal cannula. No stridor heard at this time. RT will continue to monitor and be available as needed.   Trilby Leaver Larsen Zettel 08/18/2021, 11:40 AM

## 2021-08-19 DIAGNOSIS — I10 Essential (primary) hypertension: Secondary | ICD-10-CM

## 2021-08-19 DIAGNOSIS — R061 Stridor: Secondary | ICD-10-CM

## 2021-08-19 DIAGNOSIS — F03918 Unspecified dementia, unspecified severity, with other behavioral disturbance: Secondary | ICD-10-CM

## 2021-08-19 DIAGNOSIS — F0390 Unspecified dementia without behavioral disturbance: Secondary | ICD-10-CM

## 2021-08-19 NOTE — Progress Notes (Addendum)
ANTICOAGULATION CONSULT NOTE - Initial Consult  Pharmacy Consult for IV heparin/warfarin  Indication: atrial fibrillation  No Known Allergies  Patient Measurements: Height: 5\' 5"  (165.1 cm) Weight: 79.7 kg (175 lb 11.3 oz) IBW/kg (Calculated) : 57 Heparin Dosing Weight: 73.8 kg   Vital Signs: Temp: 98.9 F (37.2 C) (07/11 0749) Temp Source: Oral (07/11 0749) BP: 156/65 (07/11 0900) Pulse Rate: 202 (07/11 0700)  Labs: Recent Labs    08/17/21 0237 08/17/21 0859 08/18/21 0421 08/19/21 0411 08/19/21 0743  HGB 12.0  --  11.9* 11.7*  --   HCT 36.5  --  36.5 35.6*  --   PLT 171  --  173 209  --   LABPROT  --  27.0* 26.9*  --  18.8*  INR  --  2.5* 2.5*  --  1.6*  CREATININE  --  1.46* 1.47* 1.48*  --     Estimated Creatinine Clearance: 31.6 mL/min (A) (by C-G formula based on SCr of 1.48 mg/dL (H)).   Medical History: No past medical history on file.  Medications:  Infusions:   feeding supplement (OSMOLITE 1.2 CAL) Stopped (08/19/21 0645)   levETIRAcetam 1,500 mg (08/19/21 0936)    Assessment: 80 YOF with mechanical mitral valve and atrial fibrillation. On warfarin PTA 3 mg daily except 2.5 mg onTues/Thurs. INR at 1.6 subtherapeutic. Pharmacy consulted to dose IV heparin bridge to warfarin. Given possible recent infarct will target lower heparin level. CBC stable and no bleeding noted.   Goal of Therapy:  Heparin level 0.3-0.5 units/ml  INR 2-3 per outpatient cardiologist  Monitor platelets by anticoagulation protocol: Yes   Plan:  No bolus Start IV heparin at 950 units/hr Obtain heparin level in 8 hours Warfarin 4 mg PO x 1  Monitor CBC and heparin level daily while on heparin Monitor for s/sx bleeding   10/20/21, PharmD PGY1 Pharmacy Resident   08/19/2021  10:03 AM

## 2021-08-19 NOTE — Progress Notes (Signed)
Nutrition Follow-up  DOCUMENTATION CODES:   Not applicable  INTERVENTION:   Magic cup TID with meals, each supplement provides 290 kcal and 9 grams of protein  Encouraged PO intake   NUTRITION DIAGNOSIS:   Inadequate oral intake related to acute illness as evidenced by NPO status. Ongoing.   GOAL:   Patient will meet greater than or equal to 90% of their needs Progressing.   MONITOR:   TF tolerance, Vent status, Labs, Weight trends, Skin  REASON FOR ASSESSMENT:   Consult, Ventilator Enteral/tube feeding initiation and management  ASSESSMENT:   81 yo female admitted status epilepticus, intubated for airway protection Pt transferred from [facility] to [facility] pot witnessed seizure, vomiting and intubation. PMH includes CVA, ventriculomegaly, seizures HTN, cardiomyopathy, mixed dementia, HLD  Pt discussed during ICU rounds and with RN and MD.  Sherron Monday with pt and RN while pt eating lunch. RN assisting pt with utensils but pt able to feed herself. Pt reports she does not like milkshakes but loves ice cream. Would like to try orange magic cups.   7/7 intubated  7/10 extubated; s/p cortrak placement  7/11 pt pulled out cortrak in am, diet advanced by SLP  Medications reviewed and include: decadron, SSI  Labs reviewed:  CBG's: 128-193  UOP: 895 ml   NUTRITION - FOCUSED PHYSICAL EXAM:  Deferred while pt eating lunch with assistance from RN  Diet Order:   Diet Order             Diet regular Room service appropriate? Yes; Fluid consistency: Thin  Diet effective now                   EDUCATION NEEDS:   Not appropriate for education at this time  Skin:  Skin Assessment: Reviewed RN Assessment  Last BM:  7/10  Height:   Ht Readings from Last 1 Encounters:  08/19/21 5\' 5"  (1.651 m)    Weight:   Wt Readings from Last 1 Encounters:  08/19/21 79.7 kg    BMI:  Body mass index is 29.24 kg/m.  Estimated Nutritional Needs:   Kcal:  1600-1800  kcals  Protein:  80-95 g  Fluid:  >/= 1.6 L  Valinda Fedie P., RD, LDN, CNSC See AMiON for contact information

## 2021-08-19 NOTE — Progress Notes (Addendum)
NEUROLOGY CONSULTATION PROGRESS NOTE   Date of service: August 19, 2021 Patient Name: Miranda Rhodes MRN:  [id number] DOB:  May 26, 1940  Brief HPI  Miranda Rhodes is a 81 y.o. female with PMH significant for mechanical mitral valve and A-fib on warfarin, hypertension, epilepsy on Keppra 500 mg twice daily who presented initially to [facility] emergency department with prolonged seizure/status. Given Keppra 3G, Ativan 2mg  and intubated and started on propofol.  Transferred to [facility] for cEEG.   Interval Hx   Extubated, no sedation, following commands this morning   Vitals   Vitals:   08/19/21 0700 08/19/21 0749 08/19/21 0800 08/19/21 0900  BP: (!) 148/57  (!) 171/67 (!) 156/65  Pulse: (!) 202     Resp: (!) 23  (!) 24 (!) 21  Temp:  98.9 F (37.2 C)    TempSrc:  Oral    SpO2: 100%     Weight:      Height:         Body mass index is 29.24 kg/m.  Physical Exam   General: Awake alert oriented to self Able to tell me her name, got her age wrong. HEENT: Normocephalic atraumatic CVs: Regular rhythm Respiratory: Breathing well saturating normally on room air Abdomen nondistended nontender Neuro exam Awake alert oriented to self Not oriented to place or time Poor attention concentration Follows simple commands Speech is mildly hypophonic Able to name simple objects inconsistently Cranial nerves II to XII grossly intact Motor examination with symmetric strength and no drift in all fours Sensation intact light touch Coordination with no gross dysmetria  Labs   Basic Metabolic Panel:  Lab Results  Component Value Date   NA 139 08/19/2021   K 4.6 08/19/2021   CO2 22 08/19/2021   GLUCOSE 171 (H) 08/19/2021   BUN 32 (H) 08/19/2021   CREATININE 1.48 (H) 08/19/2021   CALCIUM 8.7 (L) 08/19/2021   GFRNONAA 36 (L) 08/19/2021   HbA1c:  Lab Results  Component Value Date   HGBA1C 5.5 08/17/2021   LDL: No results found for: "LDLCALC" Urine Drug Screen:      Component Value Date/Time   LABOPIA NONE DETECTED 08/15/2021 1400   COCAINSCRNUR NONE DETECTED 08/15/2021 1400   LABBENZ NONE DETECTED 08/15/2021 1400   AMPHETMU NONE DETECTED 08/15/2021 1400   THCU NONE DETECTED 08/15/2021 1400   LABBARB NONE DETECTED 08/15/2021 1400    Alcohol Level     Component Value Date/Time   ETH <10 08/15/2021 1340   Imaging and Diagnostic studies   CT Head without contrast(Personally reviewed): CTH was negative for a large hypodensity concerning for a large territory infarct or hyperdensity concerning for an ICH    CT angio Head and Neck with contrast(Personally reviewed): No LVO   CT perfusion head(personally reviewed):   MRI Brain: IMPRESSION: 1. No acute abnormality. 2. Similar ventriculomegaly, which is out of proportion to the degree of sulcal enlargement. This could represent normal pressure hydrocephalus or central predominant atrophy. 3. Periventricular T2/FLAIR hyperintensity, which could represent chronic microvascular ischemic disease or transependymal flow of CSF given the above findings. 4. Remote left occipital infarct.   cEEG(7/8-7/9):  This study showed evidence of epileptogenicity arising from left frontal region. There is also cortical dysfunction arising from left parieto-occipital region likely due to underlying stroke.  Additionally there is moderate diffuse encephalopathy, non specific etiology. . No seizures were seen throughout the recording.  Impression   Miranda Rhodes is a 81 y.o. female with PMH significant  for mechanical mitral valve and A-fib on warfarin, hypertension, epilepsy on Keppra 500 mg twice daily who presented initially to [facility] emergency department by EMS with right hemiplegia, right gaze deviation and aphasia along with subtle clonic movement of RUE.  Her presentation is very concerning for prolonged seizure/status epilepticus.  The underlying etiology though is unclear.   Work-up so far with CT head, CTA, CTP is nonrevealing. MRI neg for acute stroke. She was intubated and transferred to [facility]. cEEG with no seizures. She was weaned off propofol with cEEG demonstrating evidence of epileptogenicity arising from left frontal region. There is also cortical dysfunction arising from left parieto-occipital region likely due to underlying stroke. No seizures. Following commands yesterday and extubated.  Encephalopathic but nonfocal exam today.   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 - Seizure precautions - continue anticoagulation for A-fib/valve-no contraindication from a neurological standpoint. - management of rest of the comorbidities per PCCM team. - Outpatient neurology follow up in 4-6 weeks after discharge.  Inpatient neurology service will be available as needed  Plan d/w pharmacy team and Dr. -- Kendrick Fries, MD Neurologist [facility] Pager: [phone]

## 2021-08-19 NOTE — Evaluation (Signed)
Clinical/Bedside Swallow Evaluation Patient Details  Name: Miranda Rhodes MRN: [id number] Date of Birth: 08/17/1940  Today's Date: 08/19/2021 Time: SLP Start Time (ACUTE ONLY): 0945 SLP Stop Time (ACUTE ONLY): 1000 SLP Time Calculation (min) (ACUTE ONLY): 15 min  Past Medical History: No past medical history on file.  HPI:  81 year old female admitted with witnessed seizure at home. Intubated from 7/7-7/10. Pt with prior hx of afib and MR s/p MVR w/ St. Jude valve in 2005 on coumadin, HTN, HTN cardiomyopathy, HLD, prior L PCA stroke, seizure, mixed dementia (?alzheimers/ vascular dementia).    Assessment / Plan / Recommendation  Clinical Impression  Pt demonstrates confusion, though it does improve with orientation activities and assisted feeding. Pt is able to take sips and bites without signs of aspiration. Pt has audible breathing, but voice is clear. When taking rapid consecutive intake pt did have a throat clear and appeared to struggle with timing a bit. She was able to self feed solids without dentures in place. Pt able to initiate thin liquids and regular solids with supervision. Will f/u for tolerance at least once. SLP Visit Diagnosis: Dysphagia, oropharyngeal phase (R13.12)    Aspiration Risk  Mild aspiration risk    Diet Recommendation Regular;Thin liquid   Liquid Administration via: Cup;Straw Medication Administration: Whole meds with liquid Supervision: Patient able to self feed    Other  Recommendations      Recommendations for follow up therapy are one component of a multi-disciplinary discharge planning process, led by the attending physician.  Recommendations may be updated based on patient status, additional functional criteria and insurance authorization.  Follow up Recommendations Skilled nursing-short term rehab (<3 hours/day)      Assistance Recommended at Discharge Frequent or constant Supervision/Assistance  Functional Status Assessment Patient has had a  recent decline in their functional status and demonstrates the ability to make significant improvements in function in a reasonable and predictable amount of time.  Frequency and Duration            Prognosis        Swallow Study   General HPI: 81 year old female admitted with witnessed seizure at home. Intubated from 7/7-7/10. Pt with prior hx of afib and MR s/p MVR w/ St. Jude valve in 2005 on coumadin, HTN, HTN cardiomyopathy, HLD, prior L PCA stroke, seizure, mixed dementia (?alzheimers/ vascular dementia). Type of Study: Bedside Swallow Evaluation Diet Prior to this Study: NPO Temperature Spikes Noted: No Respiratory Status: Room air History of Recent Intubation: Yes Length of Intubations (days): 4 days Date extubated: 08/18/21 Behavior/Cognition: Alert;Cooperative;Requires cueing Oral Cavity - Dentition: Edentulous Self-Feeding Abilities: Needs assist Patient Positioning: Upright in bed Baseline Vocal Quality: Normal    Oral/Motor/Sensory Function Overall Oral Motor/Sensory Function: Within functional limits   Ice Chips     Thin Liquid Thin Liquid: Within functional limits Presentation: Cup;Straw    Nectar Thick Nectar Thick Liquid: Not tested   Honey Thick Honey Thick Liquid: Not tested   Puree Puree: Within functional limits   Solid     Solid: Within functional limits      Keshan Reha, Riley Nearing 08/19/2021,1:45 PM

## 2021-08-19 NOTE — Progress Notes (Signed)
NAME:  Miranda Rhodes, MRN:  [id number], DOB:  March 30, 1940, LOS: 4 ADMISSION DATE:  08/15/2021, CONSULTATION DATE:  08/15/21 REFERRING MD:  Dr. Selina Cooley, CHIEF COMPLAINT:  Seizure   History of Present Illness:  HPI obtained from medical chart review as patient is currently intubated and sedated on mechanical ventilation.  Also, pending chart merger with MRN [id number].   81 year old female with prior hx of afib and MR s/p MVR w/ St. Jude valve in 2005 on coumadin, HTN, HTN cardiomyopathy, HLD, prior L PCA stroke, seizure, mixed dementia (?alzheimers/ vascular dementia) who presented to [facility] ER after witnessed seizure at home.  Patient with prior hx of seizure in 05/2020, unexplained at that time, and since been on keppra with no reported seizures since.    EMS reported patient vomited prior to ER arrival.  In ER, code stroke was activated given her continued altered mental status, aphasia, right sided hemiparesis, and rightward gaze with some jerking movements noted.  Code stroke activated.  Seen by Neurology.  Initial CTH and CTA and perfusion were negative for LVO.   She was given ativan and keppra load which abated movements and intubated for airway protection. Afebrile, hypertensive as high as 194/89, NSR and labs significant for sCr 1.47 (previous 1.4 in 05/2021), normal WBC and lactate, neg UA, INR 2.2 and CXR showing satisfactory placement of ETT/ OGT, questionable RML infiltrate.   She was transferred to [facility] for continous EEG monitoring and further care, PCCM admitting.   Spoke with son, Marvetta Gibbons by phone.  Patient was in her normal state of health prior to events today.  Reports compliance with keppra.  Notes hx of possible dementia however patient is independent in in her ADLs.   Pertinent  Medical History  Afib and MR s/p MVR w/ St. Jude 2005 on coumadin, HTN, HTN cardiomyopathy, HLD, prior L PCA stroke, seizure, mixed dementia (?alzheimer's/ vascular dementia)  Significant Hospital  Events: Including procedures, antibiotic start and stop dates in addition to other pertinent events   7/7 Tx [facility] to [facility], witnessed seizure, vomited, intubated  7/8 sedation weaned off while on EEG to observe for recurrent seizures none seen 7/9 no issues overnight, off sedation. No overnight seizures on EEG.  Withdraws from pain, no follow commands off sedation.  7/11 extubated yesterday, had some stridor but no respiratory distress, no overnight events  Interim History / Subjective:  No overnight events Awake with improved stridor 895cc UOP yesterday   Objective   Blood pressure (!) 148/57, pulse (!) 202, temperature 98.9 F (37.2 C), temperature source Oral, resp. rate (!) 23, height 5\' 5"  (1.651 m), weight 79.7 kg, SpO2 100 %.        Intake/Output Summary (Last 24 hours) at 08/19/2021 0841 Last data filed at 08/19/2021 0500 Gross per 24 hour  Intake 207.52 ml  Output 895 ml  Net -687.48 ml    Filed Weights   08/18/21 0327 08/18/21 1039 08/19/21 0500  Weight: 82.7 kg 79.6 kg 79.7 kg    General:  elderly F, resting in bed awake without distress HEENT: MM pink/moist, sclera anicteric Neuro: awake, pleasantly confused, answering questions with clear speech but disoriented x3, moving all extremities to command  CV: s1s2 rrr, no m/r/g PULM:  mild inspiratory and expiratory stridor without distress, no tachypnea or dyspnea on room air GI: soft, abdomen soft Extremities: warm/dry, no edema  Skin: no rashes or lesions  Resolved Hospital Problem list    Assessment & Plan:   Breakthrough  Seizure with initial SE Hx of seizure 4/22 on keppra, Head CT and CTA head negative for acute findings.  Hx of old CVA, dementia. Right sided gaze on presentation, ? Related to old L CVA vs dementia.  -etiology remains unclear, MRI negative for acute CVA -required intubation and propofol initially, now extubated  -appreciate Neurology evaluation, cEEG with epileptogenicity in the L frontal  region without seizures -seizure precautions  -continue Keppra 1500mg  bid -follow clinical neuro exam  -early PT efforts   Acute respiratory insufficiency related to above Basilar ATX Extubated successfully 7/10 -has some post-extubation stridor, no distress, possible VCD, continue decadron   Post-intubation Dysphagia Cortrak placed yesterday, however pt pulled out this AM, did not pass bedside swallow screen, however mental status improved today -speech eval, hopefully will be able to tolerate po's, may need cortrak replaced  Hx Afib and MR s/p MVR w/St. Jude Valve  -anti-coagulation was held on admission in the event pt would need an LP -ok to resume per neuro, will start heparin as pt unable to tolerate po medication currently -INR goal 2-3 -tele monitoring    HTN/HLD Hx HTN cardiomyopathy w/ recovered EF PTA meds lasix, diovan, coreg -tele monitoring  -resume home coreg, lipitor add home diovan -IV prn medications until have enteral access  At risk for AKI, hx CKD3a Creatinine stable -Trend BMP / urinary output -Avoid nephrotoxic agents, ensure adequate renal perfusion  Hypokalemia  -monitor, replace as indicated  Best Practice (right click and "Reselect all SmartList Selections" daily)  Diet/type: NPO advance as able DVT prophylaxis: SCD, heparin GI prophylaxis: PPI Lines: N/A Foley:  N/A Code Status:  full code Last date of multidisciplinary goals of care discussion: Son, , updated via phone 7/10 on plan of care.  Pending update 7/11  Critical care time: n/a   9/11 Hoke Baer, PA-C [facility] See Amion for pager If no response to pager , please call [phone] until 7pm After 7:00 pm call Elink  [phone]   Please see [email or web address] for pager details.   From 7A-7P if no response, please call [phone] After hours, please call ELink [phone]

## 2021-08-19 NOTE — Plan of Care (Signed)
  Problem: Respiratory: Goal: Ability to maintain a clear airway and adequate ventilation will improve Outcome: Progressing   Problem: Role Relationship: Goal: Method of communication will improve Outcome: Progressing   Problem: Clinical Measurements: Goal: Cardiovascular complication will be avoided Outcome: Progressing   Problem: Skin Integrity: Goal: Risk for impaired skin integrity will decrease Outcome: Progressing

## 2021-08-19 NOTE — Progress Notes (Signed)
ANTICOAGULATION CONSULT NOTE  Pharmacy Consult for IV heparin Indication: atrial fibrillation  No Known Allergies  Patient Measurements: Height: 5\' 5"  (165.1 cm) Weight: 79.7 kg (175 lb 11.3 oz) IBW/kg (Calculated) : 57 Heparin Dosing Weight: 73.8 kg   Vital Signs: Temp: 98.1 F (36.7 C) (07/11 2000) Temp Source: Oral (07/11 2000) BP: 147/65 (07/11 2100) Pulse Rate: 67 (07/11 2100)  Labs: Recent Labs    08/17/21 0237 08/17/21 0859 08/18/21 0421 08/19/21 0411 08/19/21 0743 08/19/21 1940  HGB 12.0  --  11.9* 11.7*  --   --   HCT 36.5  --  36.5 35.6*  --   --   PLT 171  --  173 209  --   --   LABPROT  --  27.0* 26.9*  --  18.8*  --   INR  --  2.5* 2.5*  --  1.6*  --   HEPARINUNFRC  --   --   --   --   --  0.34  CREATININE  --  1.46* 1.47* 1.48*  --   --      Estimated Creatinine Clearance: 31.6 mL/min (A) (by C-G formula based on SCr of 1.48 mg/dL (H)).   Medical History: No past medical history on file.  Medications:  Infusions:   heparin 950 Units/hr (08/19/21 2100)    Assessment: 80 YOF with mechanical mitral valve and atrial fibrillation. On warfarin PTA 3 mg daily except 2.5 mg onTues/Thurs. INR at 1.6 subtherapeutic. Pharmacy consulted to dose IV heparin bridge to warfarin. Given possible recent infarct will target lower heparin level. CBC stable and no bleeding noted.   Initial heparin level is therapeutic at 0.34.  Goal of Therapy:  Heparin level 0.3-0.5 units/ml  INR 2-3 per outpatient cardiologist  Monitor platelets by anticoagulation protocol: Yes   Plan:  Continue heparin 950 units/h Daily heparin level and CBC  10/20/21, PharmD, BCPS, [facility] Clinical Pharmacist Please check AMION for all [facility] Pharmacy numbers 08/19/2021

## 2021-08-20 DIAGNOSIS — N1832 Chronic kidney disease, stage 3b: Secondary | ICD-10-CM

## 2021-08-20 DIAGNOSIS — N1831 Chronic kidney disease, stage 3a: Secondary | ICD-10-CM

## 2021-08-20 DIAGNOSIS — Z952 Presence of prosthetic heart valve: Secondary | ICD-10-CM

## 2021-08-20 DIAGNOSIS — D72829 Elevated white blood cell count, unspecified: Secondary | ICD-10-CM

## 2021-08-20 DIAGNOSIS — E876 Hypokalemia: Secondary | ICD-10-CM

## 2021-08-20 DIAGNOSIS — J9601 Acute respiratory failure with hypoxia: Secondary | ICD-10-CM

## 2021-08-20 DIAGNOSIS — R1310 Dysphagia, unspecified: Secondary | ICD-10-CM

## 2021-08-20 DIAGNOSIS — I480 Paroxysmal atrial fibrillation: Secondary | ICD-10-CM

## 2021-08-20 NOTE — Progress Notes (Signed)
eLink Physician-Brief Progress Note Patient Name: Miranda Rhodes DOB: May 08, 1940 MRN: [id number]   Date of Service  08/20/2021  HPI/Events of Note  Hypertension - BP = 188/97 with MAP = 113. Currently on Coreg and Avapro.   eICU Interventions  Plan: Hydralazine 10 mg IV Q 4 hours PRN SBP > 160 or DBP > 100.     Intervention Category Major Interventions: Hypertension - evaluation and management  Zenita Kister Eugene 08/20/2021, 2:28 AM

## 2021-08-20 NOTE — Progress Notes (Signed)
PROGRESS NOTE Miranda Rhodes  QIO:[id number] DOB: 09-22-1940 DOA: 08/15/2021 PCP: Barbette Reichmann, MD   Brief Narrative/Hospital Course: 81 y.o.f w/ hx significant for mechanical mitral valve and A-fib on warfarin, hypertension, epilepsy on Keppra 500 mg bid presented initially to [facility] with prolonged seizure/status, was given Keppra 3G, Ativan 2mg  and intubated and started on propofol. Seen by Neuro, unclear etiology after CTH/MRI and EEG.  No further seizures, extubated 7/10 and had some stridor, though improved and passed swallow screen.  Transferred to [facility] 7/12.    Subjective:  Seen this am.She is Eating, on RA,no new complaints 7/12-afebrile overnight.Labs with creatinine stable at 1.4 GFR 37, CBC with leukocytosis 13.9>18.4, INR 1.6>1.3. BP in 188-received hydralazine.  Assessment and Plan: Principal Problem:   Chronic kidney disease, stage 3b (HCC) Active Problems:   Chronic kidney disease, stage 3a (HCC)   Seizure (HCC)   Primary hypertension   Dementia (HCC)   Stridor   Acute respiratory failure with hypoxia (HCC)   Dysphagia   S/P MVR (mitral valve replacement)   Hypokalemia   PAF (paroxysmal atrial fibrillation) (HCC)   Leucocytosis   Breakthrough seizure W/ initiating SE History of seizure disorder on Keppra 500 twice daily: Presented with relatively clear right gaze deviation and aphasia and concern about prolonged seizures/status epilepticus, needing intubation underwent extensive work-up with CT and CTA CTP, MRI nonrevealing, continuous EEG with no seizures.  Weaned off propofol and extubated 7/10.  Followed by neurology on Keppra 1500 mg twice daily.  Continue seizure precaution, pulm precaution, outpatient neurology follow-up in 4 to 6 weeks.  PT OT eval  Primary hypertension History of cardiomyopathy with recovered EF: BP controlled.  On Coreg 12.5  HLD on Lipitor 10  Previous right MCA stroke PAF S/P MVR st [facility]: Patient resumed on warfarin with therapeutic  agent.  Pharmacy monitoring.  Still subtherapeutic. Recent Labs  Lab 08/16/21 0451 08/17/21 0859 08/18/21 0421 08/19/21 0743 08/20/21 0126  INR 2.4* 2.5* 2.5* 1.6* 1.3*     Dementia: Stable,  cont supportive care.  Postintubation dysphagia that has resolved 7/10.   Had NGT- got pulled out, passed swallow  Stridor-resolved, on Decadron iv> taper off. Acute respiratory failure with hypoxia-extubated 7/10-resolved  Chronic kidney disease, stage 3b-stable creatinine at 1.4 with GFR 37 Recent Labs  Lab 08/16/21 1113 08/17/21 0859 08/18/21 0421 08/19/21 0411 08/20/21 0126  BUN 16 19 24* 32* 37*  CREATININE 1.53* 1.46* 1.47* 1.48* 1.43*     Hypokalemia-resolved  Leucocytosis: Noted bump in WBC but no fever, patient on Decadron likely etiology.  Trend Recent Labs  Lab 08/16/21 0451 08/17/21 0237 08/18/21 0421 08/19/21 0411 08/20/21 0126  WBC 10.7* 10.0 13.3* 13.9* 18.4*      DVT prophylaxis: SCDs Start: 08/15/21 1750hepparin Code Status:   Code Status: Full Code Family Communication: plan of care discussed with patient at bedside. Patient status is: inpatient because of pending INR to be therapeutic, ptot eval Level of care: Med-Surg   Dispo: The patient is from: home, lives with her son            Anticipated disposition: pending PTOT eval and INR to be >2 in  Mobility Assessment (last 72 hours)     Mobility Assessment   No documentation.            Objective: Vitals last 24 hrs: Vitals:   08/20/21 0600 08/20/21 0700 08/20/21 0714 08/20/21 0800  BP: (!) 156/69 (!) 162/66 (!) 163/73 131/64  Pulse: 65 67  87  Resp: (!) 21 20  (!) 28  Temp:    98.4 F (36.9 C)  TempSrc:    Oral  SpO2: 96% 96%  94%  Weight:      Height:       Weight change: 0.1 kg  Physical Examination: General exam: alert awake,older than stated age, weak appearing. HEENT:Oral mucosa moist, Ear/Nose WNL grossly, dentition normal. Respiratory system: bilaterally diminished BS, no  use of accessory muscle Cardiovascular system: S1 & S2 +, No JVD. Gastrointestinal system: Abdomen soft,NT,ND, BS+ Nervous System:Alert, awake, moving extremities and grossly nonfocal Extremities: LE edema neg,distal peripheral pulses palpable.  Skin: No rashes,no icterus. MSK: Normal muscle bulk,tone, power  Medications reviewed:  Scheduled Meds:  atorvastatin  10 mg Oral Daily   carvedilol  12.5 mg Oral BID WC   Chlorhexidine Gluconate Cloth  6 each Topical Daily   dexamethasone (DECADRON) injection  4 mg Intravenous Q6H   insulin aspart  0-9 Units Subcutaneous Q4H   irbesartan  150 mg Oral Daily   levETIRAcetam  1,500 mg Oral BID   mouth rinse  15 mL Mouth Rinse 4 times per day   warfarin  4 mg Oral ONCE-1600   Warfarin - Pharmacist Dosing Inpatient   Does not apply q1600   Continuous Infusions:  heparin 950 Units/hr (08/20/21 0800)      Diet Order             Diet regular Room service appropriate? Yes with Assist; Fluid consistency: Thin  Diet effective now                    Nutrition Problem: Inadequate oral intake Etiology: acute illness Signs/Symptoms: NPO status Interventions: Tube feeding, Refer to RD note for recommendations   Intake/Output Summary (Last 24 hours) at 08/20/2021 0916 Last data filed at 08/20/2021 0800 Gross per 24 hour  Intake 291.34 ml  Output 750 ml  Net -458.66 ml   Net IO Since Admission: -85.66 mL [08/20/21 0916]  Wt Readings from Last 3 Encounters:  08/19/21 79.7 kg  08/17/21 83.5 kg  08/15/21 83.5 kg     Unresulted Labs (From admission, onward)     Start     Ordered   08/20/21 0500  Protime-INR  Daily at 5am,   R     Question:  Specimen collection method  Answer:  Lab=Lab collect   08/19/21 0709   08/20/21 0500  Heparin level (unfractionated)  Daily at 5am,   R     Question:  Specimen collection method  Answer:  Lab=Lab collect   08/19/21 1009   08/20/21 0500  CBC  Daily at 5am,   R     Question:  Specimen collection  method  Answer:  Lab=Lab collect   08/19/21 1009          Data Reviewed: I have personally reviewed following labs and imaging studies CBC: Recent Labs  Lab 08/15/21 1340 08/15/21 1904 08/16/21 0451 08/17/21 0237 08/18/21 0421 08/19/21 0411 08/20/21 0126  WBC 5.7  --  10.7* 10.0 13.3* 13.9* 18.4*  NEUTROABS 3.8  --   --   --   --   --   --   HGB 12.4   < > 12.1 12.0 11.9* 11.7* 11.3*  HCT 36.7   < > 33.9* 36.5 36.5 35.6* 32.6*  MCV 97.6  --  94.7 100.0 103.1* 100.3* 97.6  PLT 204  --  191 171 173 209 220   < > = values in this  interval not displayed.   Basic Metabolic Panel: Recent Labs  Lab 08/16/21 0451 08/16/21 1113 08/16/21 1607 08/17/21 0237 08/17/21 0859 08/18/21 0421 08/19/21 0411 08/20/21 0126  NA 140 139  --   --  138 137 139 137  K 3.3* 4.8  --   --  4.4 4.1 4.6 4.8  CL 102 104  --   --  106 105 106 107  CO2 23 22  --   --  22 22 22 22   GLUCOSE 100* 120*  --   --  130* 142* 171* 131*  BUN 15 16  --   --  19 24* 32* 37*  CREATININE 1.56* 1.53*  --   --  1.46* 1.47* 1.48* 1.43*  CALCIUM 8.8* 8.6*  --   --  8.5* 8.3* 8.7* 8.8*  MG 1.6*  --  3.2* 3.1*  --  2.8*  --   --   PHOS 2.6  --  2.8 2.6  --  4.0  --   --    GFR: Estimated Creatinine Clearance: 32.7 mL/min (A) (by C-G formula based on SCr of 1.43 mg/dL (H)). Liver Function Tests: Recent Labs  Lab 08/19/21 0411  AST 34  ALT 29  ALKPHOS 71  BILITOT 0.6  PROT 6.4*  ALBUMIN 2.9*   No results for input(s): "LIPASE", "AMYLASE" in the last 168 hours. No results for input(s): "AMMONIA" in the last 168 hours. Coagulation Profile: Recent Labs  Lab 08/16/21 0451 08/17/21 0859 08/18/21 0421 08/19/21 0743 08/20/21 0126  INR 2.4* 2.5* 2.5* 1.6* 1.3*   BNP (last 3 results) No results for input(s): "PROBNP" in the last 8760 hours. HbA1C: No results for input(s): "HGBA1C" in the last 72 hours. CBG: Recent Labs  Lab 08/19/21 1520 08/19/21 1932 08/19/21 2320 08/20/21 0331 08/20/21 0729   GLUCAP 204* 176* 136* 138* 134*   Lipid Profile: No results for input(s): "CHOL", "HDL", "LDLCALC", "TRIG", "CHOLHDL", "LDLDIRECT" in the last 72 hours. Thyroid Function Tests: No results for input(s): "TSH", "T4TOTAL", "FREET4", "T3FREE", "THYROIDAB" in the last 72 hours. Sepsis Labs: Recent Labs  Lab 08/15/21 1343  LATICACIDVEN 1.4    Recent Results (from the past 240 hour(s))  Resp Panel by RT-PCR (Flu A&B, Covid) Anterior Nasal Swab     Status: None   Collection Time: 08/15/21  1:25 PM   Specimen: Anterior Nasal Swab  Result Value Ref Range Status   SARS Coronavirus 2 by RT PCR NEGATIVE NEGATIVE Final    Comment: (NOTE) SARS-CoV-2 target nucleic acids are NOT DETECTED.  The SARS-CoV-2 RNA is generally detectable in upper respiratory specimens during the acute phase of infection. The lowest concentration of SARS-CoV-2 viral copies this assay can detect is 138 copies/mL. A negative result does not preclude SARS-Cov-2 infection and should not be used as the sole basis for treatment or other patient management decisions. A negative result may occur with  improper specimen collection/handling, submission of specimen other than nasopharyngeal swab, presence of viral mutation(s) within the areas targeted by this assay, and inadequate number of viral copies(<138 copies/mL). A negative result must be combined with clinical observations, patient history, and epidemiological information. The expected result is Negative.  Fact Sheet for Patients:  EntrepreneurPulse.com.au  Fact Sheet for Healthcare Providers:  IncredibleEmployment.be  This test is no t yet approved or cleared by the [place] FDA and  has been authorized for detection and/or diagnosis of SARS-CoV-2 by FDA under an Emergency Use Authorization (EUA). This EUA will remain  in effect (  meaning this test can be used) for the duration of the COVID-19 declaration under Section  564(b)(1) of the Act, 21 U.S.C.section 360bbb-3(b)(1), unless the authorization is terminated  or revoked sooner.       Influenza A by PCR NEGATIVE NEGATIVE Final   Influenza B by PCR NEGATIVE NEGATIVE Final    Comment: (NOTE) The Xpert Xpress SARS-CoV-2/FLU/RSV plus assay is intended as an aid in the diagnosis of influenza from Nasopharyngeal swab specimens and should not be used as a sole basis for treatment. Nasal washings and aspirates are unacceptable for Xpert Xpress SARS-CoV-2/FLU/RSV testing.  Fact Sheet for Patients: EntrepreneurPulse.com.au  Fact Sheet for Healthcare Providers: IncredibleEmployment.be  This test is not yet approved or cleared by the [place] FDA and has been authorized for detection and/or diagnosis of SARS-CoV-2 by FDA under an Emergency Use Authorization (EUA). This EUA will remain in effect (meaning this test can be used) for the duration of the COVID-19 declaration under Section 564(b)(1) of the Act, 21 U.S.C. section 360bbb-3(b)(1), unless the authorization is terminated or revoked.  Performed at [facility], [address] [place], [place] [address]   Culture, Respiratory w Gram Stain (tracheal aspirate)     Status: None   Collection Time: 08/15/21  5:50 PM   Specimen: Tracheal Aspirate; Respiratory  Result Value Ref Range Status   Specimen Description TRACHEAL ASPIRATE  Final   Special Requests NONE  Final   Gram Stain   Final    FEW WBC PRESENT,BOTH PMN AND MONONUCLEAR MODERATE GRAM POSITIVE COCCI IN PAIRS FEW GRAM NEGATIVE COCCI IN PAIRS FEW GRAM NEGATIVE RODS RARE GRAM POSITIVE RODS    Culture   Final    MODERATE Normal respiratory flora-no Staph aureus or Pseudomonas seen Performed at [facility], [place] [address], [place], [place] [address]    Report Status 08/18/2021 FINAL  Final  MRSA Next Gen by PCR, Nasal     Status: None   Collection Time: 08/15/21 10:55 PM    Specimen: Nasal Mucosa; Nasal Swab  Result Value Ref Range Status   MRSA by PCR Next Gen NOT DETECTED NOT DETECTED Final    Comment: (NOTE) The GeneXpert MRSA Assay (FDA approved for NASAL specimens only), is one component of a comprehensive MRSA colonization surveillance program. It is not intended to diagnose MRSA infection nor to guide or monitor treatment for MRSA infections. Test performance is not FDA approved in patients less than 74 years old. Performed at [facility], [place] [address], [place], [place] [address]     Antimicrobials: Anti-infectives (From admission, onward)    None      Culture/Microbiology    Component Value Date/Time   SDES TRACHEAL ASPIRATE 08/15/2021 1750   SPECREQUEST NONE 08/15/2021 1750   CULT  08/15/2021 1750    MODERATE Normal respiratory flora-no Staph aureus or Pseudomonas seen Performed at [facility], [place] [address], [place], [place] [address]    REPTSTATUS 08/18/2021 FINAL 08/15/2021 1750    Other culture-see note  Radiology Studies: DG Abd Portable 1V  Result Date: 08/18/2021 CLINICAL DATA:  Feeding tube placement EXAM: PORTABLE ABDOMEN - 1 VIEW COMPARISON:  08/15/2021 FINDINGS: Removal of nasogastric tube with placement of feeding tube. This terminates over the gastric antrum or pylorus. Nonobstructive bowel-gas pattern. Probable small bilateral pleural effusions. Cardiomegaly. IMPRESSION: Feeding tube terminating over the gastric antrum or pylorus. Electronically Signed   By: Abigail Miyamoto M.D.   On: 08/18/2021 17:29  LOS: 5 days   Antonieta Pert, MD [facility]  08/20/2021, 9:16 AM

## 2021-08-20 NOTE — Evaluation (Signed)
Physical Therapy Evaluation Patient Details Name: Miranda Rhodes MRN: [id number] DOB: 01-01-1941 Today's Date: 08/20/2021  History of Present Illness  The pt is an 81 yo female presenting from home on 7/7 after 15 min witnessed seizure. Upon arrival, pt also with R-sided gaze and weakness. MRI ned for acute abnormality. Intubated upon arrival, extubated 7/10. PMH includes: chronic afib, mechanical mitral valve on warfarin, HTN, seizure disorder on Keppra, and prior occipital stroke without residual deficits.   Clinical Impression  Pt in bed upon arrival of PT, agreeable to evaluation at this time. Prior to admission the pt reports she was mobilizing with use of RW, living with 3 sons in a 3rd floor apt, and independent with ADLs, however, no family was present to confirm this information at time of eval. The pt now presents with limitations in functional mobility, strength, coordination, power, seated and standing stability, and activity tolerance due to above dx, and will continue to benefit from skilled PT to address these deficits. The pt required modA to complete bed mobility and up to modA to maintain static sitting at EOB, she required maxA to complete sit-stands with bilateral hold on therapist arms, front-facing transfer with bilateral feet and knees blocked, but progressed to [place] within the session. Given severity of deficits compared to pt-reported baseline and level of physical assist needed for safe transfers, recommend SNF rehab prior to return home.       Recommendations for follow up therapy are one component of a multi-disciplinary discharge planning process, led by the attending physician.  Recommendations may be updated based on patient status, additional functional criteria and insurance authorization.  Follow Up Recommendations Skilled nursing-short term rehab (<3 hours/day) Can patient physically be transported by private vehicle: No    Assistance Recommended at Discharge  Frequent or constant Supervision/Assistance  Patient can return home with the following  Two people to help with walking and/or transfers;Two people to help with bathing/dressing/bathroom;Assistance with cooking/housework;Assistance with feeding;Direct supervision/assist for medications management;Direct supervision/assist for financial management;Assist for transportation;Help with stairs or ramp for entrance    Equipment Recommendations  (defer to post acute)  Recommendations for Other Services  OT consult    Functional Status Assessment Patient has had a recent decline in their functional status and demonstrates the ability to make significant improvements in function in a reasonable and predictable amount of time.     Precautions / Restrictions Precautions Precautions: Fall Precaution Comments: seizure Restrictions Weight Bearing Restrictions: No      Mobility  Bed Mobility Overal bed mobility: Needs Assistance Bed Mobility: Supine to Sit     Supine to sit: Mod assist     General bed mobility comments: modA to complete elevation of trunk, pt able to move LE and assist with scooting to sit EOB, once in seated position, reaching for UE support and unable to maintain static sitting without support or minA    Transfers Overall transfer level: Needs assistance Equipment used: 1 person hand held assist Transfers: Sit to/from Stand, Bed to chair/wheelchair/BSC Sit to Stand: Mod assist, Max assist Stand pivot transfers: Total assist         General transfer comment: pt completing sit-stand with bilateral hold on therapist arms, front-facing transfer with bilateral feet and knees blocked. pt assisting with stand and needing modA only by 3rd attempt. totalA to pivot as pt unable to motor plan to assist with movement    Ambulation/Gait  General Gait Details: pt unable to generate steps     Balance Overall balance assessment: History of Falls, Needs  assistance Sitting-balance support: Single extremity supported, Feet supported Sitting balance-Leahy Scale: Poor Sitting balance - Comments: falling even with single UE support at times, up to modA to maintain static sitting Postural control: Posterior lean Standing balance support: Bilateral upper extremity supported Standing balance-Leahy Scale: Poor Standing balance comment: dependent on therapist assist                             Pertinent Vitals/Pain Pain Assessment Pain Assessment: No/denies pain    Home Living Family/patient expects to be discharged to:: Private residence Living Arrangements: Children Available Help at Discharge: Family;Friend(s);Available 24 hours/day Type of Home: Apartment Home Access: Stairs to enter Entrance Stairs-Rails: Left Entrance Stairs-Number of Steps: 2 flights (pt in 3rd floor apt)   Home Layout: One level Home Equipment: Agricultural consultant (2 wheels) Additional Comments: pt is unreliable historian    Prior Function Prior Level of Function : Independent/Modified Independent             Mobility Comments: no exercise or activity, uses RW in the home, states no falls in 6 months initially, then later that her RLE "gives out" on her often resulting in falls ADLs Comments: pt reports a friend drives her around, reports independent with ADL     Hand Dominance   Dominant Hand: Right    Extremity/Trunk Assessment   Upper Extremity Assessment Upper Extremity Assessment: Generalized weakness;RUE deficits/detail RUE Deficits / Details: grossly 3+/5, only able to maintain against min resistance. no clear answer on difference in sensation RUE Coordination: decreased fine motor;decreased gross motor    Lower Extremity Assessment Lower Extremity Assessment: Generalized weakness (limited testing due to cognition, pt able to maintain against min-mod resistance but poor coordination and motor planning once standing. buckling bilaterally  in stance)    Cervical / Trunk Assessment Cervical / Trunk Assessment: Kyphotic  Communication   Communication:  (perseverates at times)  Cognition Arousal/Alertness: Awake/alert Behavior During Therapy: Flat affect Overall Cognitive Status: No family/caregiver present to determine baseline cognitive functioning Area of Impairment: Attention, Memory, Following commands, Safety/judgement, Awareness, Problem solving                   Current Attention Level: Focused Memory: Decreased recall of precautions, Decreased short-term memory Following Commands: Follows one step commands inconsistently Safety/Judgement: Decreased awareness of safety, Decreased awareness of deficits Awareness: Intellectual Problem Solving: Slow processing, Decreased initiation, Difficulty sequencing, Requires verbal cues General Comments: pt answering questions regarding home set up and PLOF, gave mostly consistent answers, but no familypresent to confirm. Pt also perseverating on certain answers through session. needing max cues with verbal and tactile cues to complete LE movements to command. often moving both or the RLE when asked to kick her LLE. pt fearful of falling        General Comments General comments (skin integrity, edema, etc.): VSS on RA.        Assessment/Plan    PT Assessment Patient needs continued PT services  PT Problem List Decreased strength;Decreased range of motion;Decreased activity tolerance;Decreased balance;Decreased mobility;Decreased coordination;Decreased cognition;Decreased knowledge of use of DME;Decreased safety awareness       PT Treatment Interventions DME instruction;Gait training;Stair training;Therapeutic activities;Functional mobility training;Therapeutic exercise;Balance training;Patient/family education    PT Goals (Current goals can be found in the Care Plan section)  Acute Rehab PT Goals Patient Stated  Goal: return home PT Goal Formulation: With  patient Time For Goal Achievement: 09/03/21 Potential to Achieve Goals: Good    Frequency Min 3X/week        AM-PAC PT "6 Clicks" Mobility  Outcome Measure Help needed turning from your back to your side while in a flat bed without using bedrails?: A Little Help needed moving from lying on your back to sitting on the side of a flat bed without using bedrails?: A Lot Help needed moving to and from a bed to a chair (including a wheelchair)?: Total Help needed standing up from a chair using your arms (e.g., wheelchair or bedside chair)?: A Lot Help needed to walk in hospital room?: Total Help needed climbing 3-5 steps with a railing? : Total 6 Click Score: 10    End of Session Equipment Utilized During Treatment: Gait belt Activity Tolerance: Patient tolerated treatment well;Patient limited by fatigue Patient left: in chair;with call bell/phone within reach;with nursing/sitter in room;with chair alarm set Nurse Communication: Mobility status (need for stedy) PT Visit Diagnosis: Unsteadiness on feet (R26.81);Other abnormalities of gait and mobility (R26.89);Muscle weakness (generalized) (M62.81);Difficulty in walking, not elsewhere classified (R26.2)    Time: 8563-1497 PT Time Calculation (min) (ACUTE ONLY): 35 min   Charges:   PT Evaluation $PT Eval Moderate Complexity: 1 Mod PT Treatments $Therapeutic Activity: 8-22 mins        Vickki Muff, PT, DPT   Acute Rehabilitation Department  Ronnie Derby 08/20/2021, 12:53 PM

## 2021-08-20 NOTE — Progress Notes (Signed)
ANTICOAGULATION CONSULT NOTE  Pharmacy Consult for IV heparin/warfarin  Indication: atrial fibrillation  No Known Allergies  Patient Measurements: Height: 5\' 5"  (165.1 cm) Weight: 79.7 kg (175 lb 11.3 oz) IBW/kg (Calculated) : 57 Heparin Dosing Weight: 73.8 kg   Vital Signs: Temp: 98.4 F (36.9 C) (07/12 0400) Temp Source: Oral (07/12 0400) BP: 163/73 (07/12 0714) Pulse Rate: 67 (07/12 0700)  Labs: Recent Labs    08/18/21 0421 08/19/21 0411 08/19/21 0743 08/19/21 1940 08/20/21 0126  HGB 11.9* 11.7*  --   --  11.3*  HCT 36.5 35.6*  --   --  32.6*  PLT 173 209  --   --  220  LABPROT 26.9*  --  18.8*  --  16.3*  INR 2.5*  --  1.6*  --  1.3*  HEPARINUNFRC  --   --   --  0.34 0.37  CREATININE 1.47* 1.48*  --   --  1.43*     Estimated Creatinine Clearance: 32.7 mL/min (A) (by C-G formula based on SCr of 1.43 mg/dL (H)).   Assessment: 80 YOF with mechanical mitral valve and atrial fibrillation on warfarin 3 mg daily except 2.5 mg onTues/Thurs PTA. Pharmacy consulted to dose IV heparin bridge to warfarin. Given possible recent infarct will target lower heparin level.   Heparin level therapeutic and INR trended down further to 1.3 (warfarin held 7/7-7/10).  CBC stable; no bleeding reported.  Goal of Therapy:  Heparin level 0.3-0.5 units/ml  INR 2-3 per outpatient cardiologist, aim for 2.5-3 if possible Monitor platelets by anticoagulation protocol: Yes   Plan:  Continue IV heparin at 950 units/hr Repeat warfarin 4mg  PO x 1  Daily heparin level, PT/INR and CBC Monitor for s/sx bleeding  Delonda Coley D. , PharmD, BCPS, BCCCP 08/20/2021, 7:17 AM

## 2021-08-21 NOTE — Progress Notes (Signed)
ANTICOAGULATION CONSULT NOTE  Pharmacy Consult for IV heparin Indication: atrial fibrillation  No Known Allergies  Patient Measurements: Height: 5\' 5"  (165.1 cm) Weight: 83 kg (183 lb) IBW/kg (Calculated) : 57 Heparin Dosing Weight: 73.8 kg   Vital Signs: Temp: 98.2 F (36.8 C) (07/13 1114) Temp Source: Oral (07/13 1114) BP: 140/59 (07/13 1114) Pulse Rate: 62 (07/13 1114)  Labs: Recent Labs    08/19/21 0411 08/19/21 0743 08/19/21 1940 08/20/21 0126 08/21/21 0136 08/21/21 1424  HGB 11.7*  --   --  11.3* 11.3*  --   HCT 35.6*  --   --  32.6* 32.8*  --   PLT 209  --   --  220 246  --   LABPROT  --  18.8*  --  16.3* 18.5*  --   INR  --  1.6*  --  1.3* 1.6*  --   HEPARINUNFRC  --   --    < > 0.37 0.61 0.47  CREATININE 1.48*  --   --  1.43*  --   --    < > = values in this interval not displayed.     Estimated Creatinine Clearance: 33.4 mL/min (A) (by C-G formula based on SCr of 1.43 mg/dL (H)).   Assessment: 80 YOF with mechanical mitral valve and atrial fibrillation on warfarin 3 mg daily except 2.5 mg on Tues/Thurs PTA. Pharmacy consulted to dose IV heparin bridge to warfarin. Given possible recent infarct will target lower heparin level.   Heparin level therapeutic at 0.47 units/mL post rate reduction this AM.  INR trended up to 1.6 (warfarin held 7/7-7/10).  CBC stable; no bleeding reported.  Goal of Therapy:  Heparin level 0.3-0.5 units/ml  INR 2-3 per outpatient cardiologist, aim for 2.5-3 if possible Monitor platelets by anticoagulation protocol: Yes   Plan:  Decrease IV heparin slightly to 850 units/hr to maintain therapeutic range Daily heparin level, PT/INR and CBC Monitor for s/sx bleeding  Muhsin Doris D. 03-18-1982, PharmD, BCPS, BCCCP 08/21/2021, 3:13 PM

## 2021-08-21 NOTE — Progress Notes (Addendum)
ANTICOAGULATION CONSULT NOTE- Follow-Up  Pharmacy Consult for IV heparin/warfarin  Indication: atrial fibrillation  No Known Allergies  Patient Measurements: Height: 5\' 5"  (165.1 cm) Weight: 83 kg (183 lb) IBW/kg (Calculated) : 57 Heparin Dosing Weight: 73.8 kg   Vital Signs: Temp: 97.8 F (36.6 C) (07/13 0743) Temp Source: Oral (07/13 0743) BP: 167/87 (07/13 0743) Pulse Rate: 61 (07/13 0743)  Labs: Recent Labs    08/19/21 0411 08/19/21 0743 08/19/21 1940 08/20/21 0126 08/21/21 0136  HGB 11.7*  --   --  11.3* 11.3*  HCT 35.6*  --   --  32.6* 32.8*  PLT 209  --   --  220 246  LABPROT  --  18.8*  --  16.3* 18.5*  INR  --  1.6*  --  1.3* 1.6*  HEPARINUNFRC  --   --  0.34 0.37 0.61  CREATININE 1.48*  --   --  1.43*  --      Estimated Creatinine Clearance: 33.4 mL/min (A) (by C-G formula based on SCr of 1.43 mg/dL (H)).   Assessment: 80 YOF with mechanical mitral valve and atrial fibrillation on warfarin 3 mg daily except 2.5 mg on Tues/Thurs PTA. Pharmacy consulted to dose IV heparin bridge to warfarin. Given possible recent infarct will target lower heparin level.   Heparin level supratherapeutic at 0.61 and INR trended up further to 1.6 (warfarin held 7/7-7/10).  CBC stable; no bleeding reported.  Goal of Therapy:  Heparin level 0.3-0.5 units/ml  INR 2-3 per outpatient cardiologist, aim for 2.5-3 if possible Monitor platelets by anticoagulation protocol: Yes   Plan:  Decrease IV heparin to 900 units/hr Repeat warfarin 4mg  PO x 1  Get heparin level in 6 hours (~1500),  Daily PT/INR and CBC Monitor for s/sx bleeding  07-10-1974, PharmD. [facility] Acute Care PGY-1  08/21/2021 8:33 AM   _______________________________________________________________ I discussed / reviewed the pharmacy note by Dr. [facility] and I agree with the resident's findings and plans as documented.  08/23/2021 BS, PharmD, BCPS Clinical Pharmacist 08/21/2021 8:57 AM  Contact:  [phone] after 3 PM  "Be curious, not judgmental..." -08/23/2021 _______________________________________________________________

## 2021-08-21 NOTE — Evaluation (Signed)
Occupational Therapy Evaluation Patient Details Name: Miranda Rhodes MRN: [id number] DOB: 11-13-1940 Today's Date: 08/21/2021   History of Present Illness The pt is an 81 yo female presenting from home on 7/7 after 15 min witnessed seizure. Upon arrival, pt also with R-sided gaze and weakness. MRI ned for acute abnormality. Intubated upon arrival, extubated 7/10. PMH includes: chronic afib, mechanical mitral valve on warfarin, HTN, seizure disorder on Keppra, and prior occipital stroke without residual deficits.   Clinical Impression    PT admitted with the above diagnosis and has the deficits listed below. Pt would benefit from cont OT to increase independence and safety with basic adls and adl transfers so pt can d/c back home with her two sons.  Female family member in room inconsistent with telling therapist if pt if someone is always home with pt.  If someone is always home, pt may be able to dc home after a few more days of therapy.  Pt doing better today than yesterday although remains confused stating she lives in 3rd floor apartment. Pt lives in trailer with 2 sons.  If pt does not have 24 hour assist, pt may need SNF.       Recommendations for follow up therapy are one component of a multi-disciplinary discharge planning process, led by the attending physician.  Recommendations may be updated based on patient status, additional functional criteria and insurance authorization.   Follow Up Recommendations  Skilled nursing-short term rehab (<3 hours/day)    Assistance Recommended at Discharge Frequent or constant Supervision/Assistance  Patient can return home with the following A little help with walking and/or transfers;A little help with bathing/dressing/bathroom;Assistance with cooking/housework;Direct supervision/assist for medications management;Direct supervision/assist for financial management;Assist for transportation;Help with stairs or ramp for entrance    Functional Status  Assessment  Patient has had a recent decline in their functional status and demonstrates the ability to make significant improvements in function in a reasonable and predictable amount of time.  Equipment Recommendations  None recommended by OT    Recommendations for Other Services       Precautions / Restrictions Precautions Precautions: Fall Precaution Comments: seizure Restrictions Weight Bearing Restrictions: No      Mobility Bed Mobility Overal bed mobility: Needs Assistance Bed Mobility: Supine to Sit     Supine to sit: Min assist     General bed mobility comments: Pt more independent with all mobility today.  Pt required very little assist getting to EOB compared to 7/12    Transfers Overall transfer level: Needs assistance Equipment used: Rolling walker (2 wheels) Transfers: Sit to/from Stand, Bed to chair/wheelchair/BSC Sit to Stand: Min assist Stand pivot transfers: Mod assist         General transfer comment: Pt required min assist on one occasion to come sit to stand and mod assist on second attempt.  Once up, pt transferred with min assist and required assist to reach back to chair to sit slowly.      Balance Overall balance assessment: History of Falls, Needs assistance Sitting-balance support: Bilateral upper extremity supported, Feet supported Sitting balance-Leahy Scale: Fair     Standing balance support: Bilateral upper extremity supported, During functional activity, Reliant on assistive device for balance Standing balance-Leahy Scale: Poor Standing balance comment: dependent out outside support                           ADL either performed or assessed with clinical judgement   ADL  Overall ADL's : Needs assistance/impaired Eating/Feeding: Sitting;Supervision/ safety   Grooming: Wash/dry hands;Wash/dry face;Set up;Sitting   Upper Body Bathing: Minimal assistance;Sitting   Lower Body Bathing: Moderate assistance;Sit to/from  stand;Cueing for compensatory techniques   Upper Body Dressing : Minimal assistance;Sitting   Lower Body Dressing: Moderate assistance;Sit to/from stand;Cueing for compensatory techniques Lower Body Dressing Details (indicate cue type and reason): Pt sat in figure 4 to donn socks without assist. Pt requires assist when in standing to manage clothing Toilet Transfer: Minimal assistance;Stand-pivot;Rolling walker (2 wheels)   Toileting- Clothing Manipulation and Hygiene: Minimal assistance;Sit to/from stand       Functional mobility during ADLs: Moderate assistance;Rolling walker (2 wheels) General ADL Comments: Pt continues to have some confusion which limits safety with adls and limited by decrease balance during activites in standing.     Vision Baseline Vision/History: 0 No visual deficits Ability to See in Adequate Light: 0 Adequate Patient Visual Report: No change from baseline Vision Assessment?: No apparent visual deficits     Perception     Praxis      Pertinent Vitals/Pain Pain Assessment Pain Assessment: No/denies pain     Hand Dominance Right   Extremity/Trunk Assessment Upper Extremity Assessment Upper Extremity Assessment: RUE deficits/detail RUE Deficits / Details: grossly 4/5 RUE Sensation: decreased light touch RUE Coordination: decreased fine motor;decreased gross motor   Lower Extremity Assessment Lower Extremity Assessment: Defer to PT evaluation   Cervical / Trunk Assessment Cervical / Trunk Assessment: Kyphotic   Communication Communication Communication: No difficulties   Cognition Arousal/Alertness: Awake/alert Behavior During Therapy: WFL for tasks assessed/performed Overall Cognitive Status: Impaired/Different from baseline Area of Impairment: Orientation, Attention, Memory, Safety/judgement, Awareness, Problem solving                 Orientation Level: Time, Place, Situation Current Attention Level: Sustained Memory: Decreased  recall of precautions, Decreased short-term memory Following Commands: Follows one step commands consistently Safety/Judgement: Decreased awareness of safety, Decreased awareness of deficits Awareness: Intellectual Problem Solving: Slow processing, Decreased initiation, Difficulty sequencing, Requires verbal cues General Comments: Pt with poor memory when it comes to history and living environment. Family available to assist.     General Comments  Pt most limited by confusion and decreased balance coming to stand.    Exercises     Shoulder Instructions      Home Living Family/patient expects to be discharged to:: Private residence Living Arrangements: Children Available Help at Discharge: Family;Friend(s);Available 24 hours/day Type of Home: Mobile home Home Access: Ramped entrance     Home Layout: One level     Bathroom Shower/Tub: Chief Strategy Officer: Standard     Home Equipment: Agricultural consultant (2 wheels);Cane - single point;Shower seat;Wheelchair - manual   Additional Comments: pt unreliable historian.  Sure she lives in 3rd floor apartment but family states she lives in a mobile home      Prior Functioning/Environment Prior Level of Function : Needs assist  Cognitive Assist : ADLs (cognitive)           Mobility Comments: has had several falls per pt but family states she has not had falls. ADLs Comments: Pt does not drive or cook. Family there most of the time. Pt can bathe, dress and toilet on her own.        OT Problem List: Decreased strength;Impaired balance (sitting and/or standing);Decreased cognition;Decreased coordination;Decreased safety awareness;Decreased knowledge of use of DME or AE;Impaired UE functional use;Impaired sensation      OT Treatment/Interventions:  Self-care/ADL training;Therapeutic activities;Balance training    OT Goals(Current goals can be found in the care plan section) Acute Rehab OT Goals Patient Stated Goal: to  go home asap OT Goal Formulation: With patient/family Time For Goal Achievement: 09/04/21 Potential to Achieve Goals: Fair ADL Goals Pt Will Perform Eating: with supervision;sitting Pt Will Perform Grooming: with supervision;standing Pt Will Perform Lower Body Dressing: with supervision;sit to/from stand Pt Will Transfer to Toilet: with supervision;ambulating Pt Will Perform Toileting - Clothing Manipulation and hygiene: with supervision;sit to/from stand  OT Frequency: Min 2X/week    Co-evaluation              AM-PAC OT "6 Clicks" Daily Activity     Outcome Measure Help from another person eating meals?: A Little Help from another person taking care of personal grooming?: A Little Help from another person toileting, which includes using toliet, bedpan, or urinal?: A Lot Help from another person bathing (including washing, rinsing, drying)?: A Lot Help from another person to put on and taking off regular upper body clothing?: A Little Help from another person to put on and taking off regular lower body clothing?: A Lot 6 Click Score: 15   End of Session Equipment Utilized During Treatment: Rolling walker (2 wheels) Nurse Communication: Mobility status  Activity Tolerance: Patient tolerated treatment well Patient left: in chair;with call bell/phone within reach;with chair alarm set;with family/visitor present  OT Visit Diagnosis: Unsteadiness on feet (R26.81)                Time: [id number] OT Time Calculation (min): 33 min Charges:  OT General Charges $OT Visit: 1 Visit OT Evaluation $OT Eval Moderate Complexity: 1 Mod OT Treatments $Self Care/Home Management : 8-22 mins  Glenford Peers 08/21/2021, 10:59 AM

## 2021-08-21 NOTE — Progress Notes (Signed)
PROGRESS NOTE Miranda AYDELOTTE  GMW:[id number] DOB: Jan 24, 1941 DOA: 08/15/2021 PCP: Barbette Reichmann, MD   Brief Narrative/Hospital Course: 81 y.o.f w/ hx significant for mechanical mitral valve and A-fib on warfarin, hypertension, epilepsy on Keppra 500 mg bid presented initially to [facility] with prolonged seizure/status, was given Keppra 3G, Ativan 2mg  and intubated and started on propofol. Seen by Neuro, unclear etiology after CTH/MRI and EEG.  No further seizures, extubated 7/10 and had some stridor, though improved and passed swallow screen.  Transferred to [facility] 7/12.  Overall improving, seen by PT OT recommending skilled nursing facility    Subjective: Seen and examined this morning.  Granddaughter at the bedside.  Patient resting comfortably on room air has no new complaints.  She feels she did pretty good with PT OT this morning Lat 24 hrs afebrile, BP 130s to 160s, on RA  Assessment and Plan: Principal Problem:   Chronic kidney disease, stage 3b (HCC) Active Problems:   Chronic kidney disease, stage 3a (HCC)   Seizure (HCC)   Primary hypertension   Dementia (HCC)   Stridor   Acute respiratory failure with hypoxia (HCC)   Dysphagia   S/P MVR (mitral valve replacement)   Hypokalemia   PAF (paroxysmal atrial fibrillation) (HCC)   Leucocytosis   Breakthrough seizure W/ initia SE History of seizure disorder on Keppra 500 MG bid Presented with relatively clear right gaze deviation and aphasia and concern about prolonged seizures/status epilepticus, needing intubation underwent extensive work-up with CT and CTA CTP, MRI nonrevealing, continuous EEG with no seizures.  Weaned off propofol and extubated 7/10.  Followed by neurology on Keppra 1500 mg twice daily.  Doing well stable, continue current meds, seizure precaution, fall precaution. outpatient neurology follow-up in 4 to 6 weeks.    Primary hypertension History of cardiomyopathy with recovered EF: BP fairly controlled.  On Coreg 12.5,  ARBs.  HLD cont Lipitor 10  Previous right MCA stroke PAF S/P MVR st Jude: back on warfarin with therapeutic heparin gtt-Pharmacy monitoring.  INR subtherapeutic. Recent Labs  Lab 08/17/21 0859 08/18/21 0421 08/19/21 0743 08/20/21 0126 08/21/21 0136  INR 2.5* 2.5* 1.6* 1.3* 1.6*    Dementia: Mentation stable,  cont supportive care. Postintubation dysphagia that has resolved 7/10. off NGT- passed swallow-tolerating p.o. Stridor-resolved, on Decadron iv> tapering off. Acute respiratory failure with hypoxia-extubated 7/10-resolved, on RA Chronic kidney disease, stage 3b-stable creatinine at 1.4 with GFR 37  Hypokalemia-resolved Leucocytosis: Noted bump in WBC but no fever, patient on Decadron likely etiology.  Now downtrending Recent Labs  Lab 08/17/21 0237 08/18/21 0421 08/19/21 0411 08/20/21 0126 08/21/21 0136  WBC 10.0 13.3* 13.9* 18.4* 13.3*     DVT prophylaxis: SCDs Start: 08/15/21 1750heparin Code Status:   Code Status: Full Code Family Communication: plan of care discussed with patient at bedside. Patient status is: inpatient because of pending INR to be therapeutic Level of care: Med-Surg   Dispo: The patient is from: home, lives with her son            Anticipated disposition: SNF once INR >2.  Patient and granddaughter does not go to SNF and wants to go home with home health.  Mobility Assessment (last 72 hours)     Mobility Assessment     Row Name 08/21/21 1000 08/20/21 1925 08/20/21 1515 08/20/21 1000     Does patient have an order for bedrest or is patient medically unstable -- No - Continue assessment No - Continue assessment --    What is the  highest level of mobility based on the progressive mobility assessment? Level 4 (Walks with assist in room) - Balance while marching in place and cannot step forward and back - Complete Level 4 (Walks with assist in room) - Balance while marching in place and cannot step forward and back - Complete Level 4 (Walks with  assist in room) - Balance while marching in place and cannot step forward and back - Complete Level 1 (Bedfast) - Unable to balance while sitting on edge of bed              Objective: Vitals last 24 hrs: Vitals:   08/21/21 0423 08/21/21 0425 08/21/21 0743 08/21/21 1114  BP: (!) 160/68  (!) 167/87 (!) 140/59  Pulse: 62  61 62  Resp: 18  14 14   Temp: 98.6 F (37 C)  97.8 F (36.6 C) 98.2 F (36.8 C)  TempSrc: Oral  Oral Oral  SpO2: 98%  100% 100%  Weight:  83 kg    Height:       Weight change: 3.308 kg  Physical Examination: General exam: AAox3, older than stated age, weak appearing. HEENT:Oral mucosa moist, Ear/Nose WNL grossly, dentition normal. Respiratory system: bilaterally diminished, no use of accessory muscle Cardiovascular system: S1 & S2 +, No JVD,. Gastrointestinal system: Abdomen soft,NT,ND,BS+ Nervous System:Alert, awake, moving extremities and grossly nonfocal Extremities: LE ankle edema neg, distal peripheral pulses palpable.  Skin: No rashes,no icterus. MSK: Normal muscle bulk,tone, power   Medications reviewed:  Scheduled Meds:  atorvastatin  10 mg Oral Daily   carvedilol  12.5 mg Oral BID WC   Chlorhexidine Gluconate Cloth  6 each Topical Daily   dexamethasone (DECADRON) injection  4 mg Intravenous Q12H   insulin aspart  0-9 Units Subcutaneous Q4H   irbesartan  150 mg Oral Daily   levETIRAcetam  1,500 mg Oral BID   mouth rinse  15 mL Mouth Rinse 4 times per day   warfarin  4 mg Oral ONCE-1600   Warfarin - Pharmacist Dosing Inpatient   Does not apply q1600   Continuous Infusions:  heparin 900 Units/hr (08/21/21 0935)      Diet Order             Diet regular Room service appropriate? Yes with Assist; Fluid consistency: Thin  Diet effective now                    Nutrition Problem: Inadequate oral intake Etiology: acute illness Signs/Symptoms: NPO status Interventions: Tube feeding, Refer to RD note for  recommendations   Intake/Output Summary (Last 24 hours) at 08/21/2021 1159 Last data filed at 08/21/2021 0900 Gross per 24 hour  Intake 304.25 ml  Output 700 ml  Net -395.75 ml   Net IO Since Admission: -212.92 mL [08/21/21 1159]  Wt Readings from Last 3 Encounters:  08/21/21 83 kg  08/17/21 83.5 kg  08/15/21 83.5 kg     Unresulted Labs (From admission, onward)     Start     Ordered   08/21/21 1500  Heparin level (unfractionated)  Once,   R       Question:  Specimen collection method  Answer:  Lab=Lab collect   08/21/21 0857   08/20/21 0500  Protime-INR  Daily at 5am,   R     Question:  Specimen collection method  Answer:  Lab=Lab collect   08/19/21 0709   08/20/21 0500  Heparin level (unfractionated)  Daily at 5am,   R  Question:  Specimen collection method  Answer:  Lab=Lab collect   08/19/21 1009   08/20/21 0500  CBC  Daily at 5am,   R     Question:  Specimen collection method  Answer:  Lab=Lab collect   08/19/21 1009          Data Reviewed: I have personally reviewed following labs and imaging studies CBC: Recent Labs  Lab 08/15/21 1340 08/15/21 1904 08/17/21 0237 08/18/21 0421 08/19/21 0411 08/20/21 0126 08/21/21 0136  WBC 5.7   < > 10.0 13.3* 13.9* 18.4* 13.3*  NEUTROABS 3.8  --   --   --   --   --   --   HGB 12.4   < > 12.0 11.9* 11.7* 11.3* 11.3*  HCT 36.7   < > 36.5 36.5 35.6* 32.6* 32.8*  MCV 97.6   < > 100.0 103.1* 100.3* 97.6 97.0  PLT 204   < > 171 173 209 220 246   < > = values in this interval not displayed.   Basic Metabolic Panel: Recent Labs  Lab 08/16/21 0451 08/16/21 1113 08/16/21 1607 08/17/21 0237 08/17/21 0859 08/18/21 0421 08/19/21 0411 08/20/21 0126  NA 140 139  --   --  138 137 139 137  K 3.3* 4.8  --   --  4.4 4.1 4.6 4.8  CL 102 104  --   --  106 105 106 107  CO2 23 22  --   --  22 22 22 22   GLUCOSE 100* 120*  --   --  130* 142* 171* 131*  BUN 15 16  --   --  19 24* 32* 37*  CREATININE 1.56* 1.53*  --   --  1.46*  1.47* 1.48* 1.43*  CALCIUM 8.8* 8.6*  --   --  8.5* 8.3* 8.7* 8.8*  MG 1.6*  --  3.2* 3.1*  --  2.8*  --   --   PHOS 2.6  --  2.8 2.6  --  4.0  --   --    GFR: Estimated Creatinine Clearance: 33.4 mL/min (A) (by C-G formula based on SCr of 1.43 mg/dL (H)). Liver Function Tests: Recent Labs  Lab 08/19/21 0411  AST 34  ALT 29  ALKPHOS 71  BILITOT 0.6  PROT 6.4*  ALBUMIN 2.9*   No results for input(s): "LIPASE", "AMYLASE" in the last 168 hours. No results for input(s): "AMMONIA" in the last 168 hours. Coagulation Profile: Recent Labs  Lab 08/17/21 0859 08/18/21 0421 08/19/21 0743 08/20/21 0126 08/21/21 0136  INR 2.5* 2.5* 1.6* 1.3* 1.6*   BNP (last 3 results) No results for input(s): "PROBNP" in the last 8760 hours. HbA1C: No results for input(s): "HGBA1C" in the last 72 hours. CBG: Recent Labs  Lab 08/20/21 1952 08/20/21 2341 08/21/21 0413 08/21/21 0744 08/21/21 1117  GLUCAP 134* 112* 126* 117* 225*   Lipid Profile: No results for input(s): "CHOL", "HDL", "LDLCALC", "TRIG", "CHOLHDL", "LDLDIRECT" in the last 72 hours. Thyroid Function Tests: No results for input(s): "TSH", "T4TOTAL", "FREET4", "T3FREE", "THYROIDAB" in the last 72 hours. Sepsis Labs: Recent Labs  Lab 08/15/21 1343  LATICACIDVEN 1.4    Recent Results (from the past 240 hour(s))  Resp Panel by RT-PCR (Flu A&B, Covid) Anterior Nasal Swab     Status: None   Collection Time: 08/15/21  1:25 PM   Specimen: Anterior Nasal Swab  Result Value Ref Range Status   SARS Coronavirus 2 by RT PCR NEGATIVE NEGATIVE Final    Comment: (  NOTE) SARS-CoV-2 target nucleic acids are NOT DETECTED.  The SARS-CoV-2 RNA is generally detectable in upper respiratory specimens during the acute phase of infection. The lowest concentration of SARS-CoV-2 viral copies this assay can detect is 138 copies/mL. A negative result does not preclude SARS-Cov-2 infection and should not be used as the sole basis for treatment  or other patient management decisions. A negative result may occur with  improper specimen collection/handling, submission of specimen other than nasopharyngeal swab, presence of viral mutation(s) within the areas targeted by this assay, and inadequate number of viral copies(<138 copies/mL). A negative result must be combined with clinical observations, patient history, and epidemiological information. The expected result is Negative.  Fact Sheet for Patients:  EntrepreneurPulse.com.au  Fact Sheet for Healthcare Providers:  IncredibleEmployment.be  This test is no t yet approved or cleared by the [place] FDA and  has been authorized for detection and/or diagnosis of SARS-CoV-2 by FDA under an Emergency Use Authorization (EUA). This EUA will remain  in effect (meaning this test can be used) for the duration of the COVID-19 declaration under Section 564(b)(1) of the Act, 21 U.S.C.section 360bbb-3(b)(1), unless the authorization is terminated  or revoked sooner.       Influenza A by PCR NEGATIVE NEGATIVE Final   Influenza B by PCR NEGATIVE NEGATIVE Final    Comment: (NOTE) The Xpert Xpress SARS-CoV-2/FLU/RSV plus assay is intended as an aid in the diagnosis of influenza from Nasopharyngeal swab specimens and should not be used as a sole basis for treatment. Nasal washings and aspirates are unacceptable for Xpert Xpress SARS-CoV-2/FLU/RSV testing.  Fact Sheet for Patients: EntrepreneurPulse.com.au  Fact Sheet for Healthcare Providers: IncredibleEmployment.be  This test is not yet approved or cleared by the [place] FDA and has been authorized for detection and/or diagnosis of SARS-CoV-2 by FDA under an Emergency Use Authorization (EUA). This EUA will remain in effect (meaning this test can be used) for the duration of the COVID-19 declaration under Section 564(b)(1) of the Act, 21 U.S.C. section  360bbb-3(b)(1), unless the authorization is terminated or revoked.  Performed at [facility], [address] [place], [place] [address]   Culture, Respiratory w Gram Stain (tracheal aspirate)     Status: None   Collection Time: 08/15/21  5:50 PM   Specimen: Tracheal Aspirate; Respiratory  Result Value Ref Range Status   Specimen Description TRACHEAL ASPIRATE  Final   Special Requests NONE  Final   Gram Stain   Final    FEW WBC PRESENT,BOTH PMN AND MONONUCLEAR MODERATE GRAM POSITIVE COCCI IN PAIRS FEW GRAM NEGATIVE COCCI IN PAIRS FEW GRAM NEGATIVE RODS RARE GRAM POSITIVE RODS    Culture   Final    MODERATE Normal respiratory flora-no Staph aureus or Pseudomonas seen Performed at [facility], [place] [address] [place], [place] [address]    Report Status 08/18/2021 FINAL  Final  MRSA Next Gen by PCR, Nasal     Status: None   Collection Time: 08/15/21 10:55 PM   Specimen: Nasal Mucosa; Nasal Swab  Result Value Ref Range Status   MRSA by PCR Next Gen NOT DETECTED NOT DETECTED Final    Comment: (NOTE) The GeneXpert MRSA Assay (FDA approved for NASAL specimens only), is one component of a comprehensive MRSA colonization surveillance program. It is not intended to diagnose MRSA infection nor to guide or monitor treatment for MRSA infections. Test performance is not FDA approved in patients less than 20 years old. Performed at [facility], [address] [place], [place] [address]     Antimicrobials: Anti-infectives (From admission, onward)    None      Culture/Microbiology    Component Value Date/Time   SDES TRACHEAL ASPIRATE 08/15/2021 1750   SPECREQUEST NONE 08/15/2021 1750   CULT  08/15/2021 1750    MODERATE Normal respiratory flora-no Staph aureus or Pseudomonas seen Performed at [facility], [address], [place], [place] [address]    REPTSTATUS 08/18/2021 FINAL 08/15/2021 1750    Other culture-see note  Radiology Studies: No  results found.   LOS: 6 days   Lanae Boast, MD [facility]  08/21/2021, 11:59 AM

## 2021-08-21 NOTE — Care Management Important Message (Signed)
Important Message  Patient Details  Name: Miranda Rhodes MRN: [id number] Date of Birth: December 03, 1940   Medicare Important Message Given:  Yes     Dorena Bodo 08/21/2021, 2:23 PM

## 2021-08-21 NOTE — TOC Initial Note (Signed)
Transition of Care [facility]) - Initial/Assessment Note    Patient Details  Name: Miranda Rhodes MRN: [id number] Date of Birth: 06-Nov-1940  [facility] CM/SW Contact:    Geralynn Ochs, LCSW Phone Number: 08/21/2021, 2:28 PM  Clinical Narrative:          CSW met with patient and granddaughter, Maudie Mercury, at bedside to discuss recommendation for SNF. Patient and granddaughter both refusing SNF. CSW checked in with patient and granddaughter about 24/7, as PT/OT have recommended constant supervision, and granddaughter discussed how she did not feel that the patient needed it, refusing that as well. CSW explained recommendation, but granddaughter continuing to refuse. Patient in agreement. Patient lives with son who is there most of the time, and granddaughter and other family check in on her multiple times a week. If patient were to need more assistance with bathing/dressing/ADLs upon return home, the granddaughter is agreeable to stepping in and assisting to avoid SNF placement. Patient and granddaughter agreeable to home health, referral provided to [facility]. CSW to follow.         Expected Discharge Plan: [place] Barriers to Discharge: Continued Medical Work up   Patient Goals and CMS Choice Patient states their goals for this hospitalization and ongoing recovery are:: to get home CMS Medicare.gov Compare Post Acute Care list provided to:: Patient Choice offered to / list presented to : Patient, Adult Children  Expected Discharge Plan and Services Expected Discharge Plan: [place] Acute Care Choice: [place] arrangements for the past 2 months: Mobile Home                           HH Arranged: PT, OT [facility] Agency: [place] Date [facility] Agency Contacted: 08/21/21   Representative spoke with at [place]: Claiborne Billings  Prior Living Arrangements/Services Living arrangements for the past 2 months: Mobile  Home Lives with:: Adult Children, Self Patient language and need for interpreter reviewed:: No Do you feel safe going back to the place where you live?: Yes      Need for Family Participation in Patient Care: Yes (Comment) Care giver support system in place?: Yes (comment) Current home services: DME Criminal Activity/Legal Involvement Pertinent to Current Situation/Hospitalization: No - Comment as needed  Activities of Daily Living      Permission Sought/Granted Permission sought to share information with : Family Supports Permission granted to share information with : Yes, Verbal Permission Granted  Share Information with NAME: Maudie Mercury     Permission granted to share info w Relationship: Granddaughter     Emotional Assessment Appearance:: Appears stated age Attitude/Demeanor/Rapport: Engaged Affect (typically observed): Appropriate Orientation: : Oriented to Self, Oriented to Place Alcohol / Substance Use: Not Applicable Psych Involvement: No (comment)  Admission diagnosis:  Status epilepticus ([place]) [G40.901] Seizure ([place]) [R56.9] Patient Active Problem List   Diagnosis Date Noted   Chronic kidney disease, stage 3b ([place]) 08/20/2021   Acute respiratory failure with hypoxia ([place]) 08/20/2021   Dysphagia 08/20/2021   S/P MVR (mitral valve replacement) 08/20/2021   Chronic kidney disease, stage 3a ([place]) 08/20/2021   Hypokalemia 08/20/2021   PAF (paroxysmal atrial fibrillation) ([place]) 08/20/2021   Leucocytosis 08/20/2021   Primary hypertension    Dementia ([place])    Stridor    Seizure ([place]) 08/15/2021   PCP:  Tracie Harrier, MD Pharmacy:   [facility], [place] -  [place] [address] Phone: [phone] Fax: [phone]     Social Determinants of Health (SDOH) Interventions    Readmission Risk Interventions     No data to display

## 2021-11-24 IMAGING — CT CT HEAD W/O CM
3 series · 16 of 47 positions shown, 19 images · non-contrast
Comparison: September 19, 2020

CLINICAL DATA: Head trauma.

EXAM:
CT HEAD WITHOUT CONTRAST
TECHNIQUE: Contiguous axial images were obtained from the base of the skull
through the vertex without intravenous contrast.

[Series 2: head wo · axial · 0.43mm/px · z∈[-98,+27]mm · 10 of 31 slices shown, 13 images]
[im 3/31  brain]
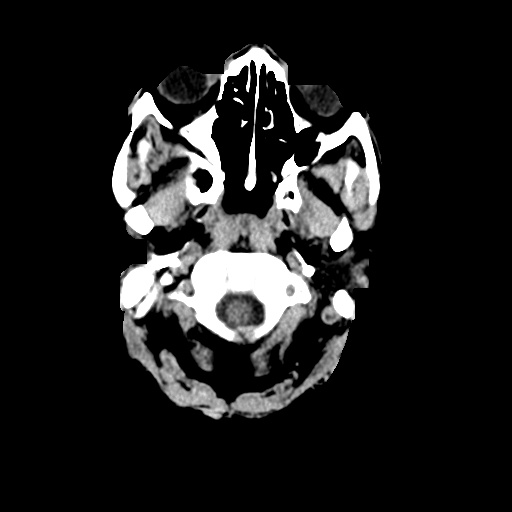
[im 3/31  bone]
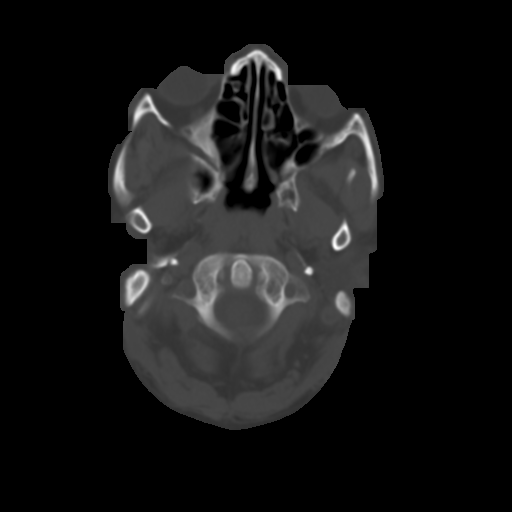
[im 6/31  brain]
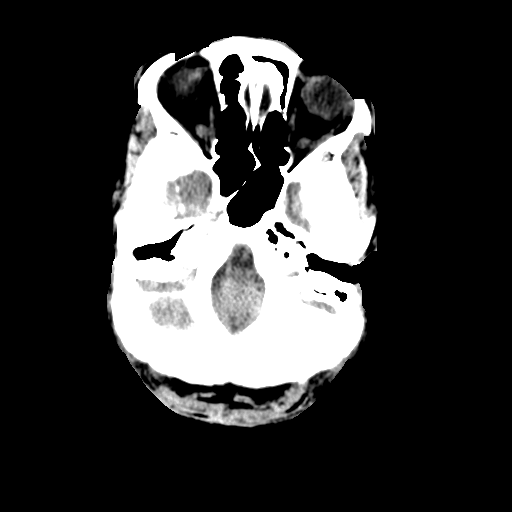
[im 9/31  brain]
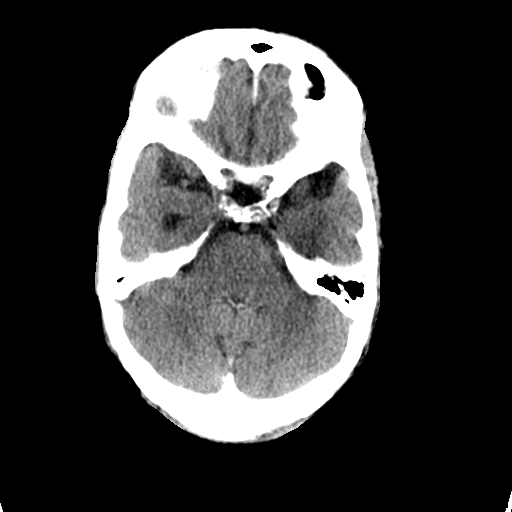
[im 11/31  brain]
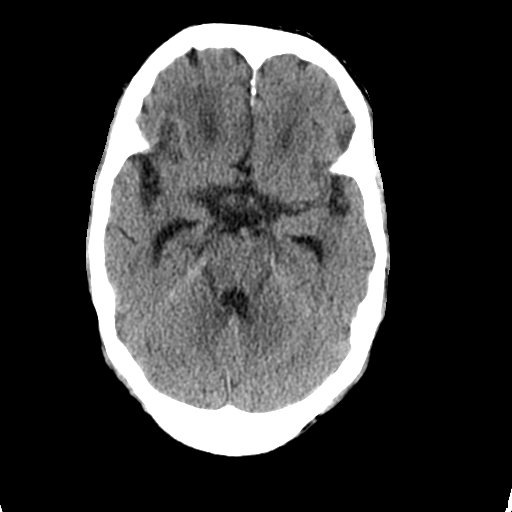
[im 14/31  brain]
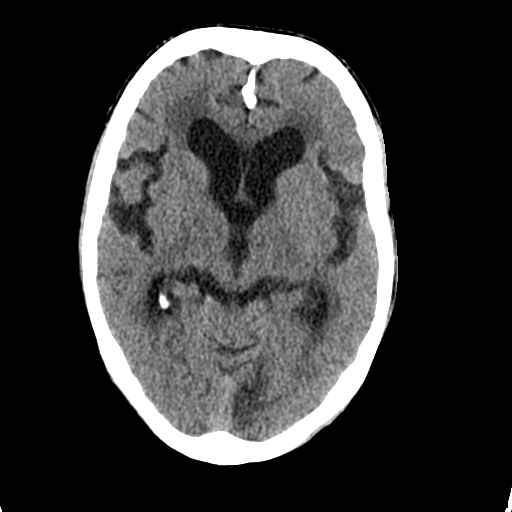
[im 14/31  bone]
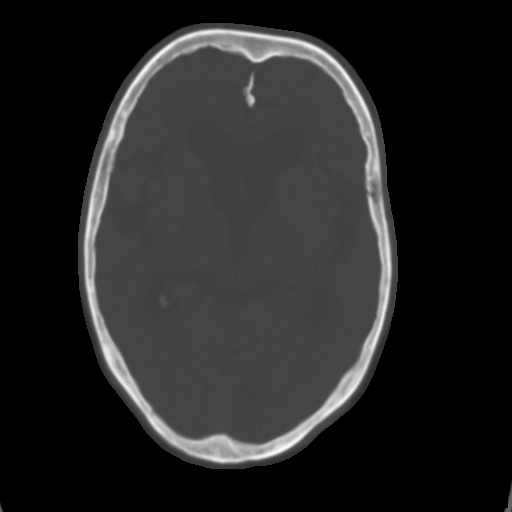
[im 17/31  brain]
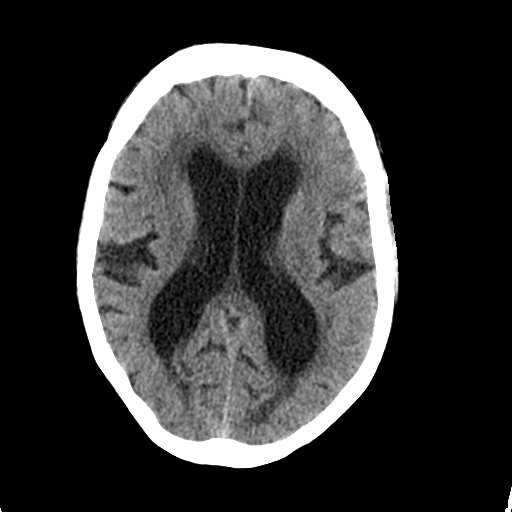
[im 20/31  brain]
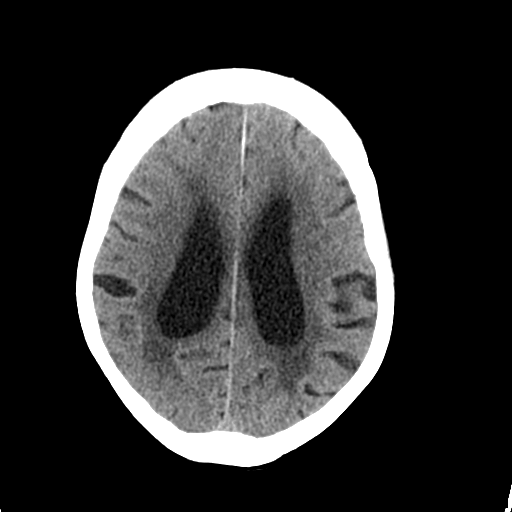
[im 23/31  brain]
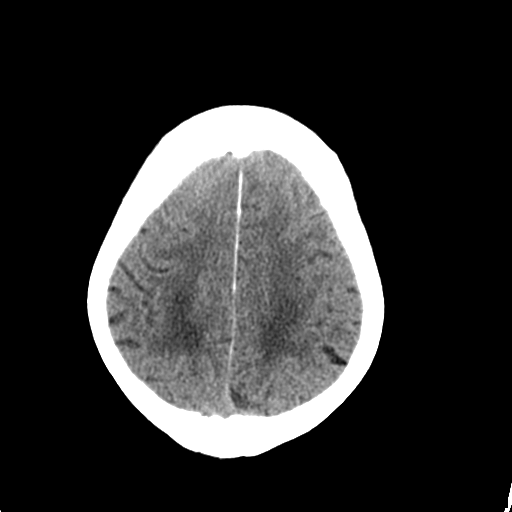
[im 25/31  brain]
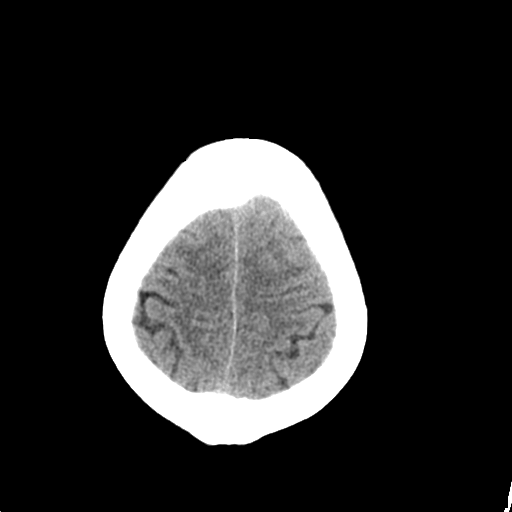
[im 25/31  bone]
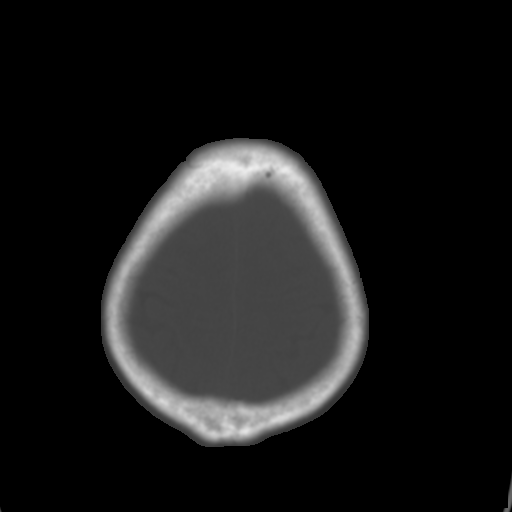
[im 28/31  brain]
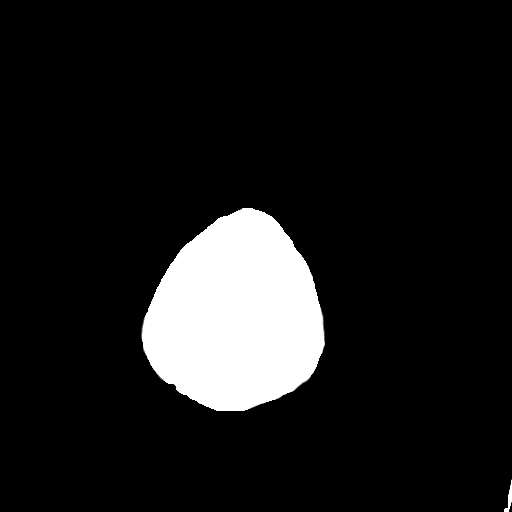

[Series 4: coronal soft tissue · coronal · 0.31mm/px · 3 of 65 slices shown]
[im 22/65  brain]
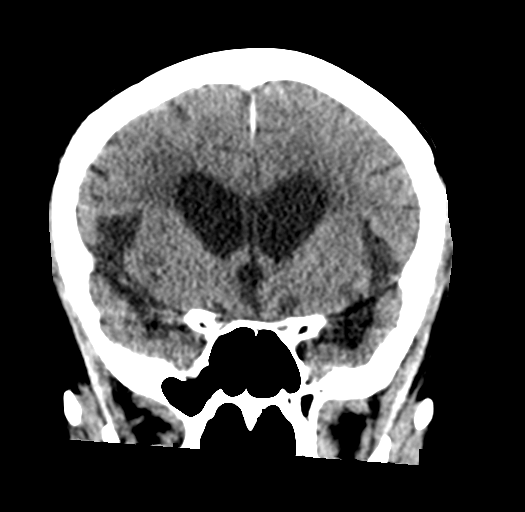
[im 29/65  brain]
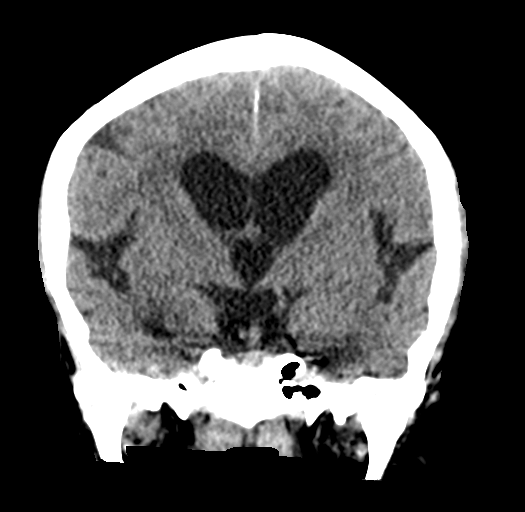
[im 36/65  brain]
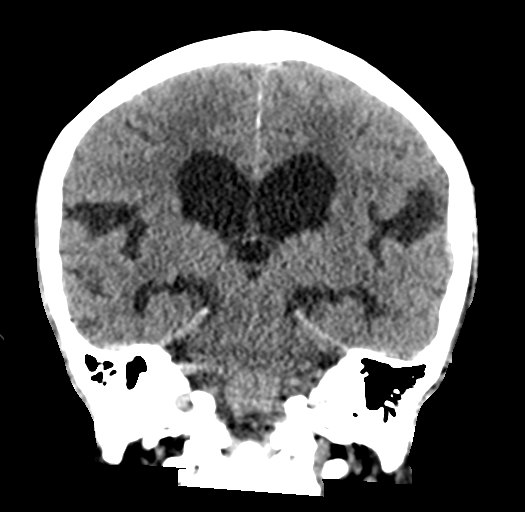

[Series 5: sagittal soft tissue · sagittal · 0.31mm/px · 3 of 53 slices shown]
[im 18/53  brain]
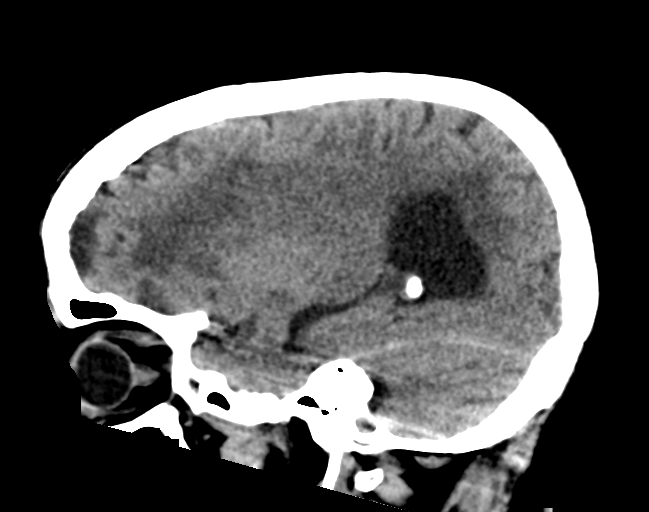
[im 27/53  brain]
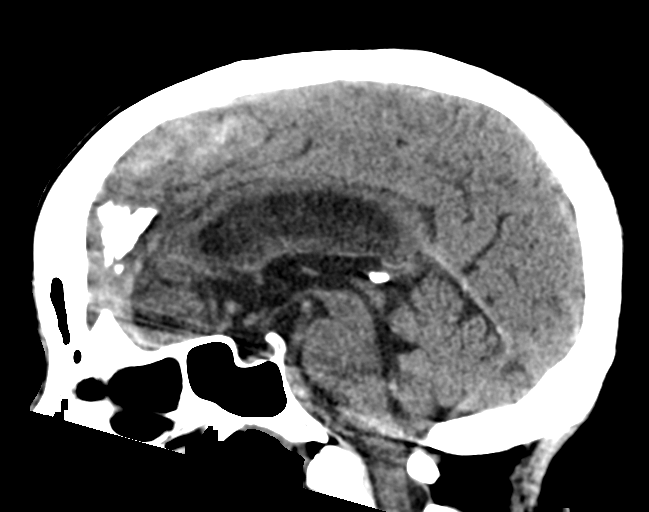
[im 35/53  brain]
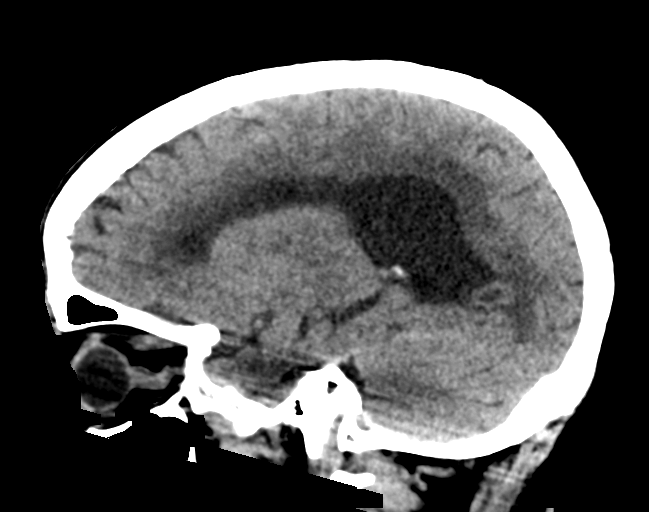

[16 of 47 positions shown; findings below may reference images not displayed]

FINDINGS: Brain: No evidence of acute infarction, hemorrhage, hydrocephalus,
extra-axial collection or mass lesion/mass effect. Moderate to
advanced brain parenchymal volume loss and deep white matter
microangiopathy. Chronic appearance of left occipital infarction.

Vascular: No hyperdense vessel or unexpected calcification.

Skull: Normal. Negative for fracture or focal lesion.

Sinuses/Orbits: No acute finding.

Other: None.
IMPRESSION: 1. No acute intracranial abnormality.
2. Moderate to advanced brain parenchymal atrophy and chronic
microvascular disease.
3. Chronic appearance of left occipital infarction.

## 2021-11-24 IMAGING — CT CT CERVICAL SPINE W/O CM
3 of 4 series · 12 of 33 positions shown, 14 images · non-contrast
Comparison: March 17, 2019

CLINICAL DATA: Head trauma.

Unwitnessed fall.
EXAM:
CT CERVICAL SPINE WITHOUT CONTRAST
TECHNIQUE: Multidetector CT imaging of the cervical spine was performed without
intravenous contrast. Multiplanar CT image reconstructions were also
generated.

[Series 4: sagittal bone · sagittal · 0.46mm/px · 5 of 83 slices shown, 6 images]
[im 28/83  bone]
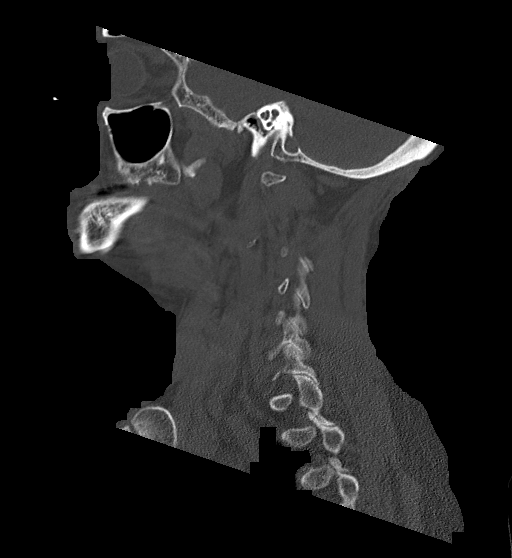
[im 35/83  bone]
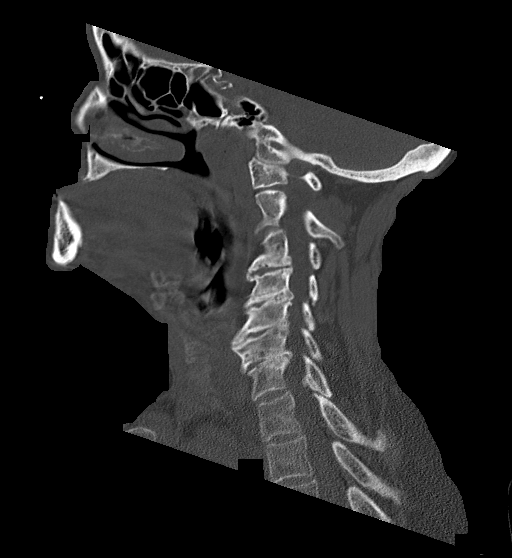
[im 42/83  soft-tissue]
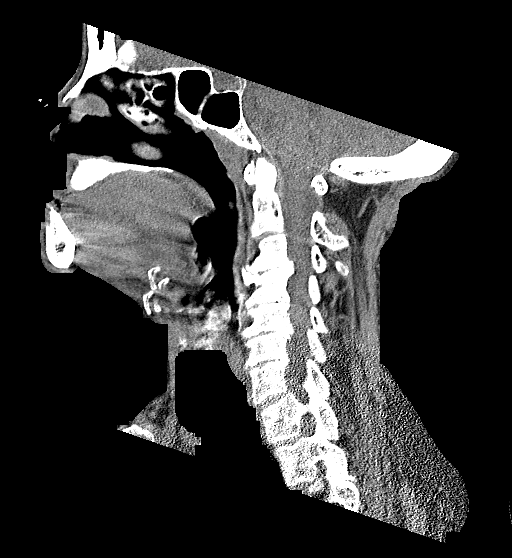
[im 42/83  bone]
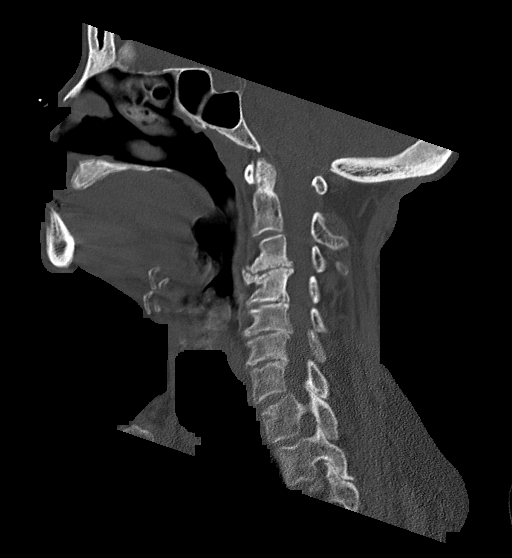
[im 48/83  bone]
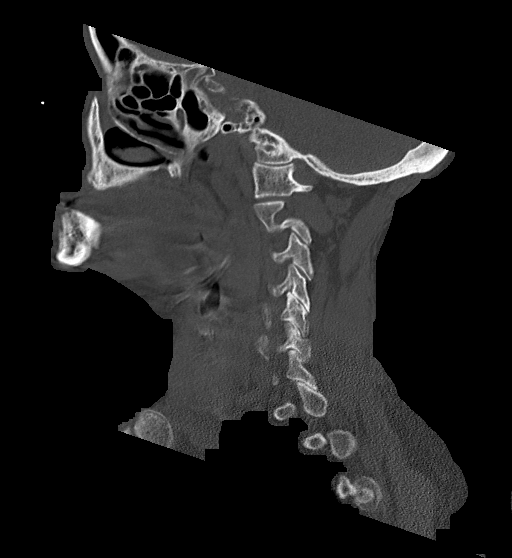
[im 55/83  bone]
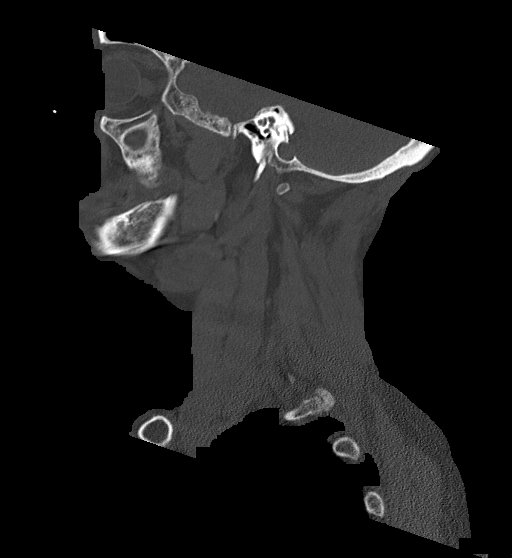

[Series 5: coronal bone · coronal · 0.33mm/px · 3 of 121 slices shown]
[im 25/121  bone]
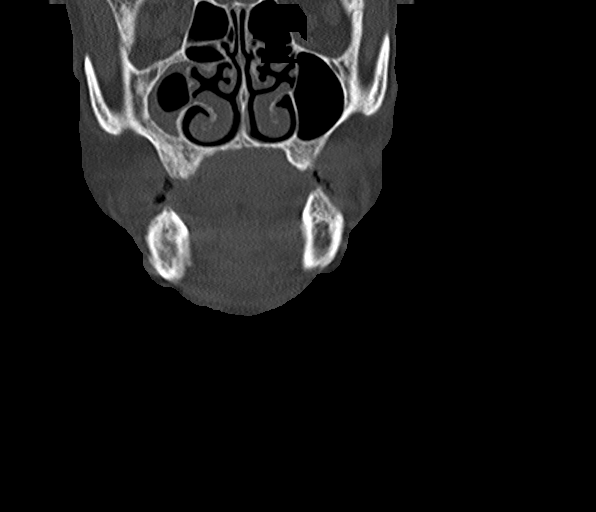
[im 49/121  bone]
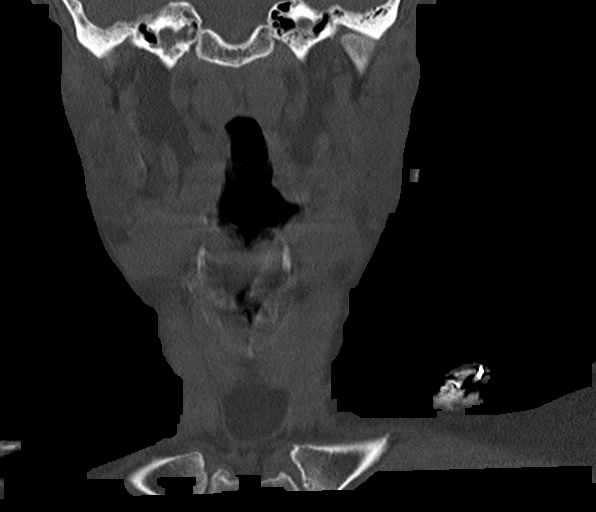
[im 73/121  bone]
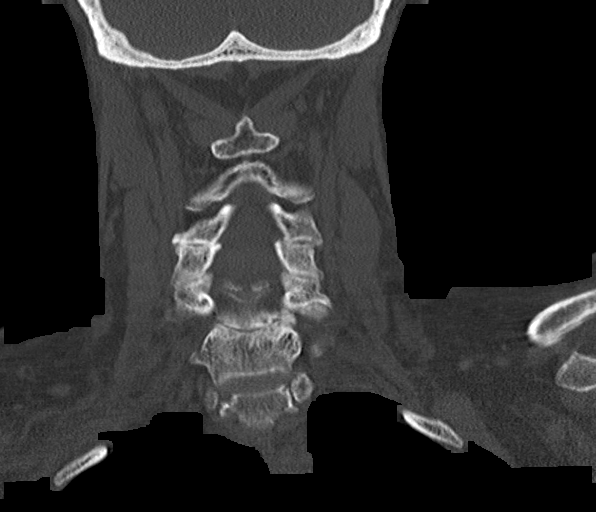

[Series 6: orthogonal bone · axial · 0.32mm/px · z∈[-286,-148]mm · 4 of 107 slices shown, 5 images]
[im 16/107  soft-tissue]
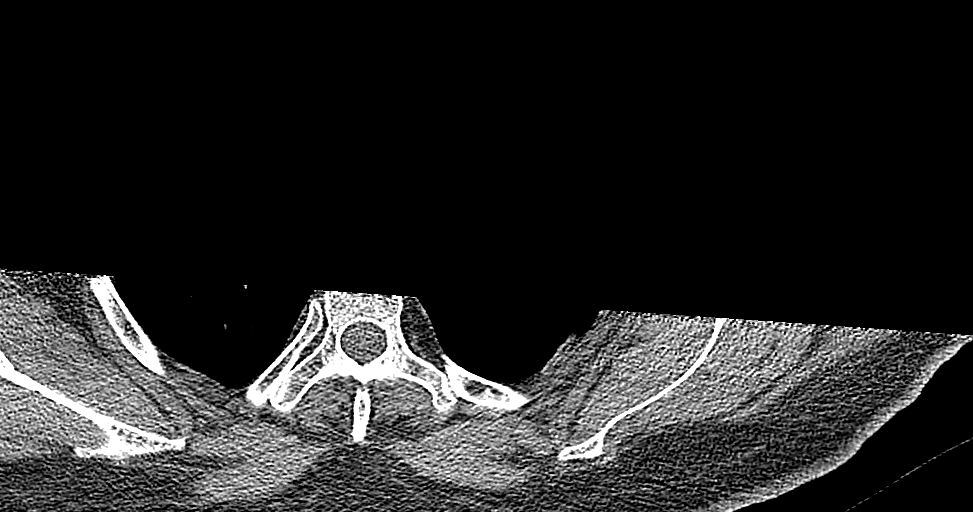
[im 16/107  bone]
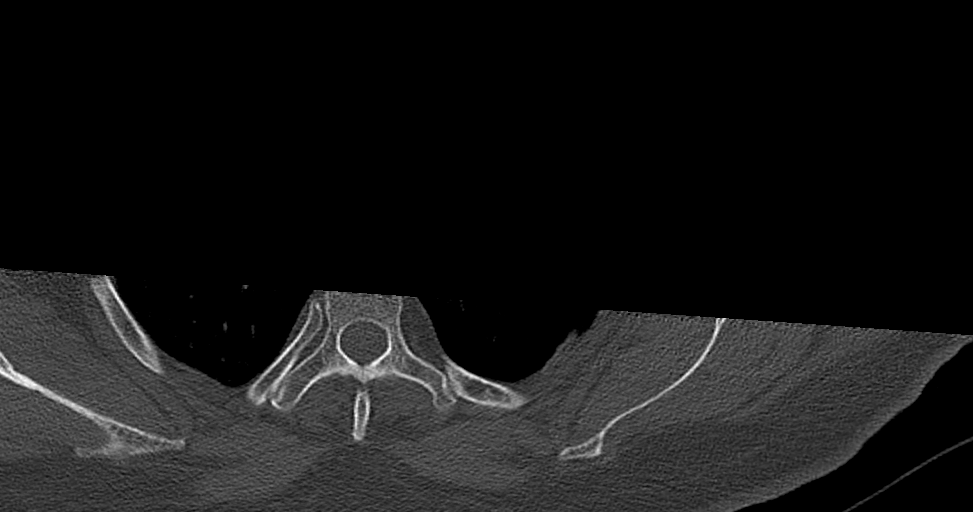
[im 46/107  bone]
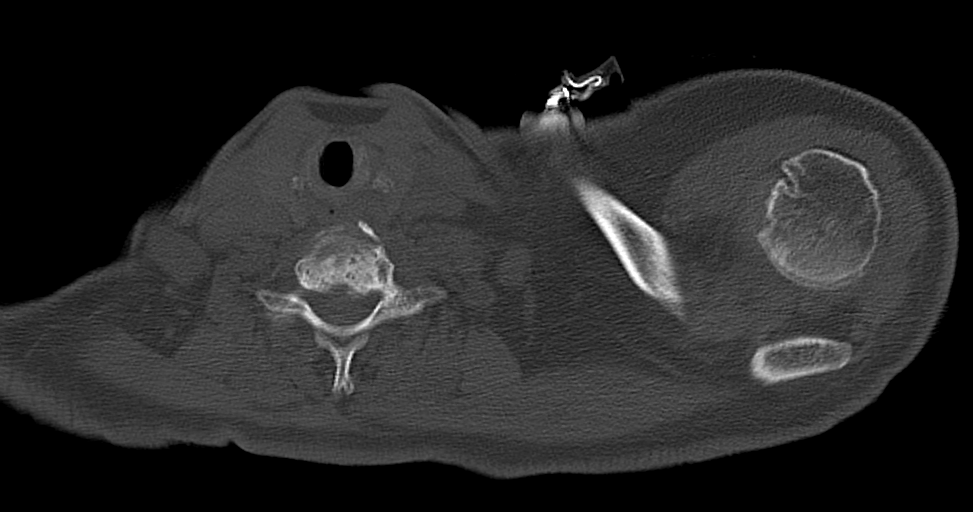
[im 61/107  bone]
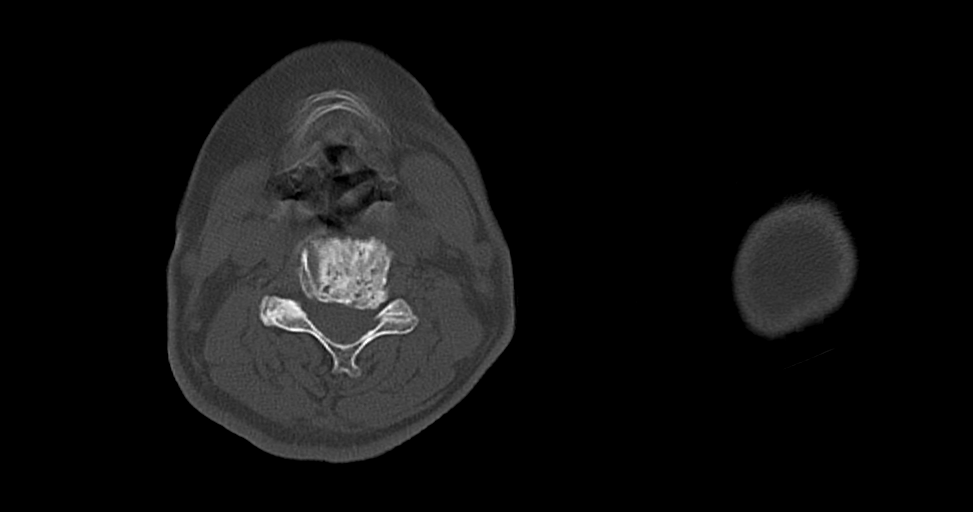
[im 91/107  bone]
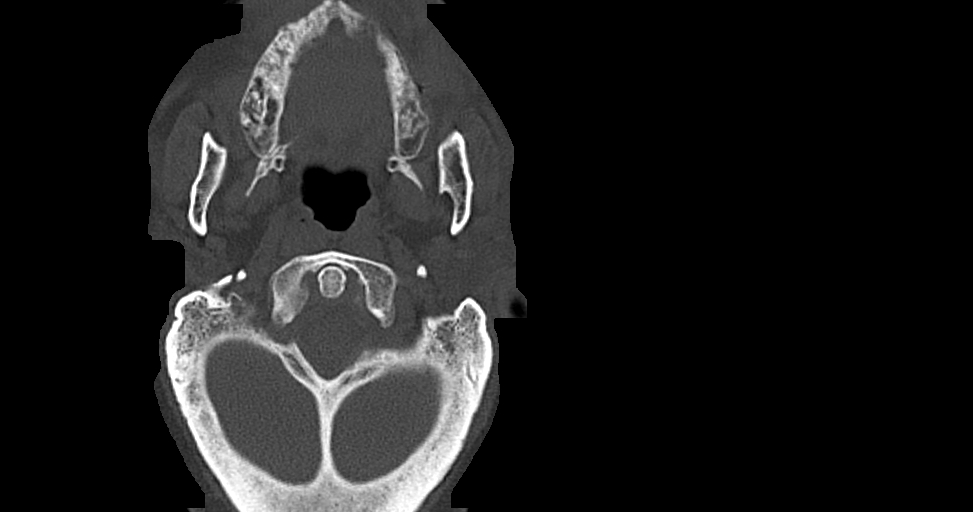

[12 of 33 positions shown; findings below may reference images not displayed]

FINDINGS: Alignment: Normal.

Skull base and vertebrae: No acute fracture. No primary bone lesion
or focal pathologic process.

Soft tissues and spinal canal: No prevertebral fluid or swelling. No
visible canal hematoma.

Disc levels: Multilevel moderate to severe osteoarthritic changes of
the cervical spine with disc space narrowing, remodeling of the
vertebral bodies, osteophytosis. Moderate associated posterior facet
arthropathy.

Upper chest: Negative.

Other: None.
IMPRESSION: 1. No evidence of acute traumatic injury to the cervical spine.
2. Multilevel moderate to severe osteoarthritic changes of the
cervical spine.
